# Patient Record
Sex: Female | Born: 1950 | Race: White | Hispanic: No | Marital: Married | Smoking: Never smoker
Health system: Southern US, Community
[De-identification: ages and names within clinical notes are randomized; demographics above are authoritative.]

## PROBLEM LIST (undated history)

## (undated) DIAGNOSIS — M1990 Unspecified osteoarthritis, unspecified site: Secondary | ICD-10-CM

## (undated) DIAGNOSIS — I10 Essential (primary) hypertension: Secondary | ICD-10-CM

## (undated) DIAGNOSIS — Z9889 Other specified postprocedural states: Secondary | ICD-10-CM

## (undated) DIAGNOSIS — E039 Hypothyroidism, unspecified: Secondary | ICD-10-CM

## (undated) HISTORY — PX: BACK SURGERY: SHX140

## (undated) HISTORY — PX: BUNIONECTOMY: SHX129

## (undated) HISTORY — PX: BREAST BIOPSY: SHX20

## (undated) HISTORY — PX: KNEE SURGERY: SHX244

---

## 2006-02-06 ENCOUNTER — Ambulatory Visit: Payer: Self-pay | Admitting: Internal Medicine

## 2007-08-26 ENCOUNTER — Other Ambulatory Visit: Payer: Self-pay

## 2007-08-26 ENCOUNTER — Emergency Department: Payer: Self-pay | Admitting: Emergency Medicine

## 2007-08-27 ENCOUNTER — Ambulatory Visit: Payer: Self-pay | Admitting: Internal Medicine

## 2007-09-12 ENCOUNTER — Ambulatory Visit: Payer: Self-pay | Admitting: Unknown Physician Specialty

## 2007-09-18 ENCOUNTER — Ambulatory Visit: Payer: Self-pay | Admitting: Unknown Physician Specialty

## 2007-09-24 ENCOUNTER — Ambulatory Visit: Payer: Self-pay | Admitting: Gynecologic Oncology

## 2007-10-08 ENCOUNTER — Ambulatory Visit: Payer: Self-pay | Admitting: Gastroenterology

## 2007-12-09 ENCOUNTER — Emergency Department: Payer: Self-pay | Admitting: Emergency Medicine

## 2008-01-20 ENCOUNTER — Other Ambulatory Visit: Payer: Self-pay

## 2008-01-20 ENCOUNTER — Inpatient Hospital Stay: Payer: Self-pay | Admitting: Cardiovascular Disease

## 2008-10-12 ENCOUNTER — Ambulatory Visit: Payer: Self-pay | Admitting: Cardiovascular Disease

## 2010-01-25 ENCOUNTER — Ambulatory Visit: Payer: Self-pay | Admitting: General Surgery

## 2010-02-09 ENCOUNTER — Ambulatory Visit: Payer: Self-pay | Admitting: General Surgery

## 2010-02-24 ENCOUNTER — Ambulatory Visit: Payer: Self-pay | Admitting: General Surgery

## 2010-10-13 ENCOUNTER — Ambulatory Visit: Payer: Self-pay | Admitting: General Surgery

## 2010-10-27 ENCOUNTER — Ambulatory Visit: Payer: Self-pay | Admitting: General Surgery

## 2010-10-28 LAB — CANCER ANTIGEN 19-9: CA 19-9: 19 U/mL (ref 0–35)

## 2010-11-26 ENCOUNTER — Ambulatory Visit: Payer: Self-pay | Admitting: General Surgery

## 2014-11-27 ENCOUNTER — Ambulatory Visit
Admit: 2014-11-27 | Disposition: A | Discharge status: Home / Self Care | Payer: Self-pay | Attending: Unknown Physician Specialty | Admitting: Unknown Physician Specialty

## 2014-12-08 DIAGNOSIS — M7542 Impingement syndrome of left shoulder: Secondary | ICD-10-CM | POA: Insufficient documentation

## 2014-12-08 DIAGNOSIS — M1712 Unilateral primary osteoarthritis, left knee: Secondary | ICD-10-CM | POA: Insufficient documentation

## 2015-02-14 DIAGNOSIS — S8002XA Contusion of left knee, initial encounter: Secondary | ICD-10-CM | POA: Insufficient documentation

## 2015-09-22 DIAGNOSIS — M25512 Pain in left shoulder: Secondary | ICD-10-CM | POA: Insufficient documentation

## 2015-11-18 DIAGNOSIS — D0500 Lobular carcinoma in situ of unspecified breast: Secondary | ICD-10-CM | POA: Insufficient documentation

## 2016-01-17 DIAGNOSIS — R748 Abnormal levels of other serum enzymes: Secondary | ICD-10-CM | POA: Insufficient documentation

## 2016-01-17 DIAGNOSIS — E079 Disorder of thyroid, unspecified: Secondary | ICD-10-CM | POA: Insufficient documentation

## 2016-01-17 DIAGNOSIS — E785 Hyperlipidemia, unspecified: Secondary | ICD-10-CM | POA: Insufficient documentation

## 2016-01-17 DIAGNOSIS — M791 Myalgia, unspecified site: Secondary | ICD-10-CM | POA: Insufficient documentation

## 2016-01-17 DIAGNOSIS — E038 Other specified hypothyroidism: Secondary | ICD-10-CM | POA: Insufficient documentation

## 2016-09-26 IMAGING — MR MRI OF THE LEFT SHOULDER WITHOUT CONTRAST
5 series · 40 of 40 positions shown · non-contrast
Comparison: None.

CLINICAL DATA: Pressure and stabbing sensation in left shoulder for
1 month with limited range of motion. No acute injury or prior
relevant surgery. Initial encounter.

EXAM:
MRI OF THE LEFT SHOULDER WITHOUT CONTRAST
TECHNIQUE: Multiplanar, multisequence MR imaging of the shoulder was performed.
No intravenous contrast was administered.

[Series 3: T2 fat-sat · axial · 4.0mm · 0.55mm/px · z∈[-24,+68]mm · 8 of 22 slices shown (1 of 3)]
[im 1/22]
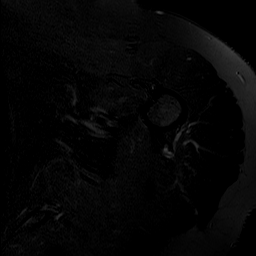
[im 4/22]
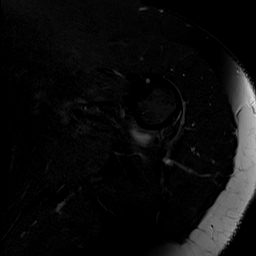
[im 7/22]
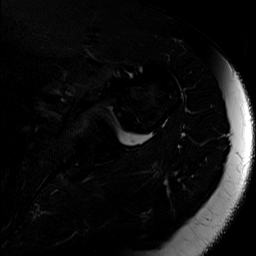
[im 10/22]
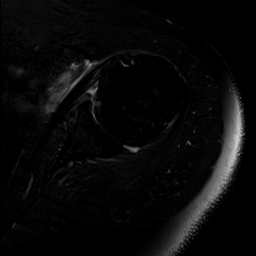
[im 13/22]
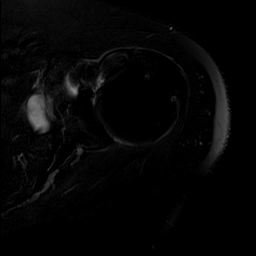
[im 16/22]
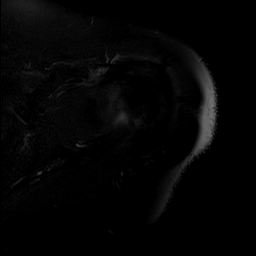
[im 19/22]
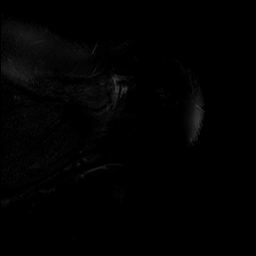
[im 22/22]
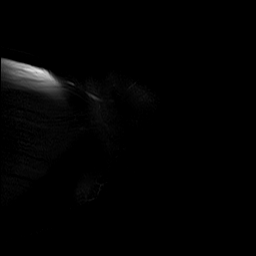

[Series 4: T2 fat-sat · coronal · 4.0mm · 0.55mm/px · 7 of 20 slices shown (2 of 3)]
[im 1/20]
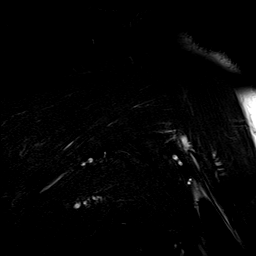
[im 4/20]
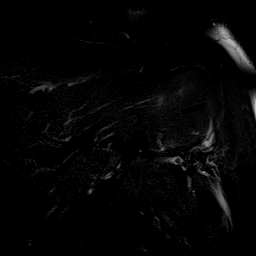
[im 7/20]
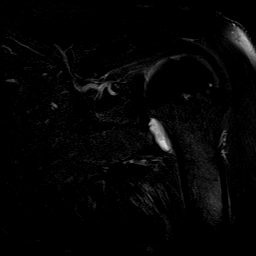
[im 10/20]
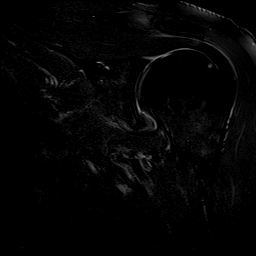
[im 13/20]
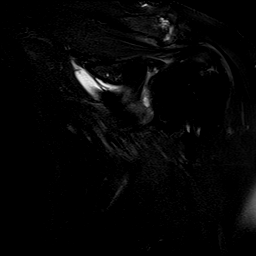
[im 16/20]
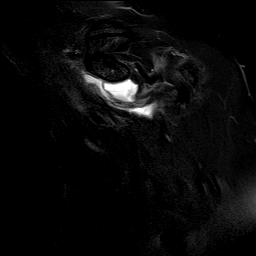
[im 20/20]
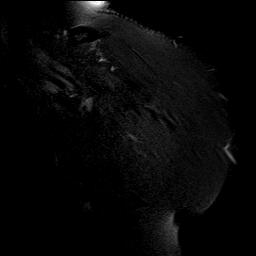

[Series 6: T1 · sagittal · 4.0mm · 0.55mm/px · 9 of 23 slices shown]
[im 1/23]
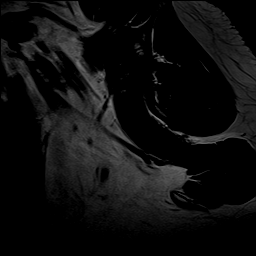
[im 3/23]
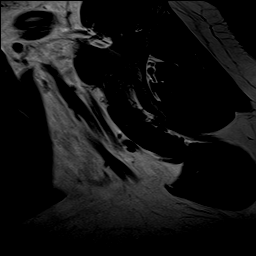
[im 6/23]
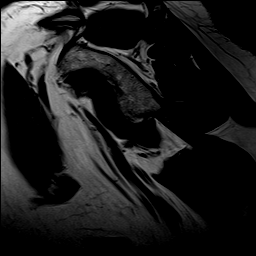
[im 9/23]
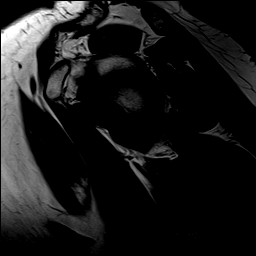
[im 12/23]
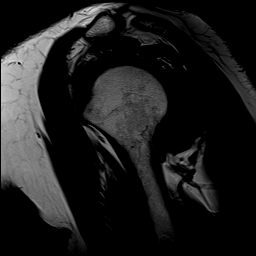
[im 14/23]
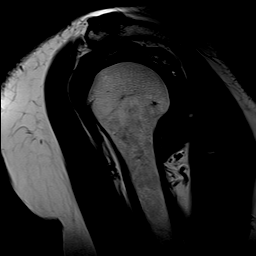
[im 17/23]
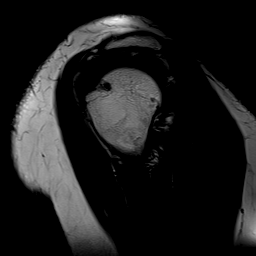
[im 20/23]
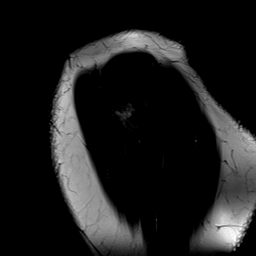
[im 23/23]
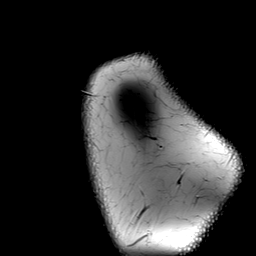

[Series 7: T2 fat-sat · sagittal · 4.0mm · 0.55mm/px · 9 of 23 slices shown (3 of 3)]
[im 1/23]
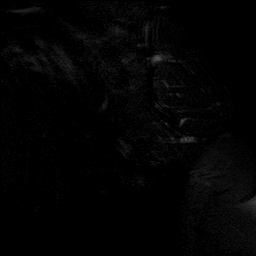
[im 3/23]
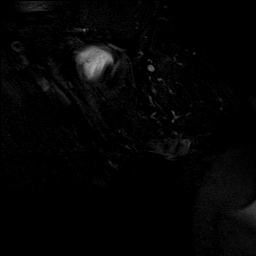
[im 6/23]
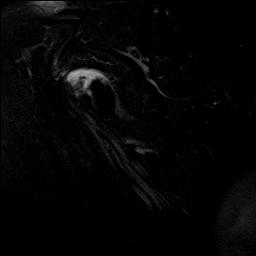
[im 9/23]
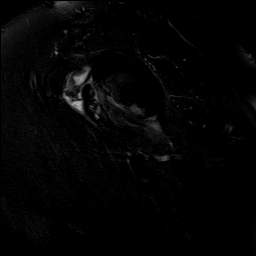
[im 12/23]
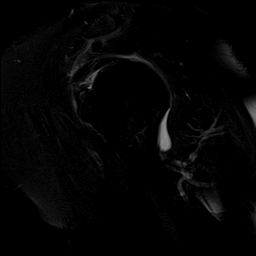
[im 14/23]
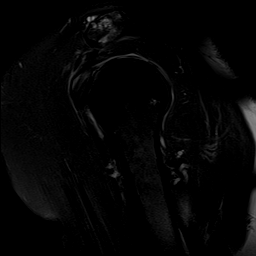
[im 17/23]
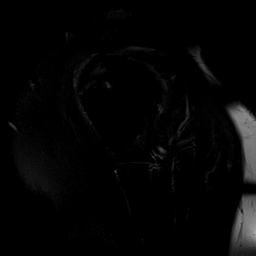
[im 20/23]
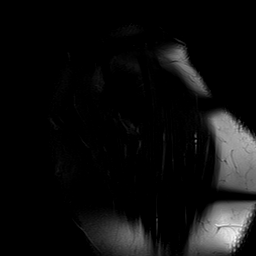
[im 23/23]
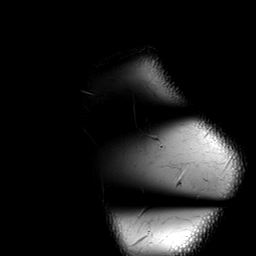

[Series 8: PD · coronal · 4.0mm · 0.55mm/px · 7 of 20 slices shown]
[im 1/20]
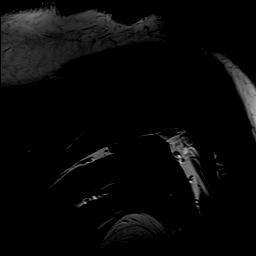
[im 4/20]
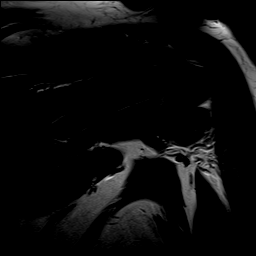
[im 7/20]
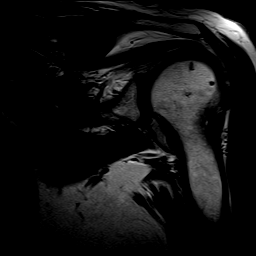
[im 10/20]
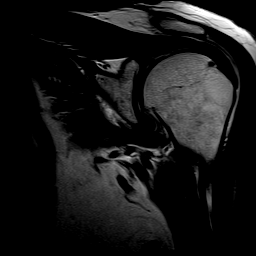
[im 13/20]
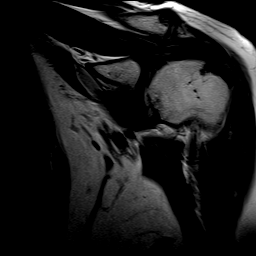
[im 16/20]
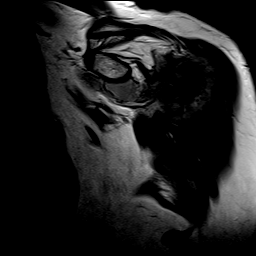
[im 20/20]
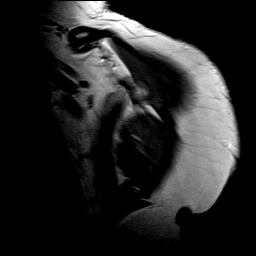

[40 of 40 positions shown; findings below may reference images not displayed]

FINDINGS: Rotator cuff: Moderate supraspinatus tendinosis with small focus of
partial articular surface insertional tearing anteriorly, best seen
on the coronal images. No full-thickness rotator cuff tear. The
subscapularis tendon also demonstrates tendinosis and mild
attenuation without focal tear. The infraspinous and teres minor
tendons appear normal.

Muscles:  No focal muscular atrophy or edema.

Biceps long head: Intact and normally positioned. The
intra-articular portion demonstrates moderate tendinosis.

Acromioclavicular Joint: The acromion is type 2. There are moderate
acromioclavicular degenerative changes. A small amount of fluid is
present in the subacromial-subdeltoid bursa.

Glenohumeral Joint: Mild glenohumeral degenerative changes. There is
a moderate size shoulder joint effusion with prominent fluid in the
superior subscapularis recess. No loose bodies observed. The joint
capsule appears mildly edematous within the axillary recess and
rotator interval, suggesting possible adhesive capsulitis.

Labrum: Mild labral degeneration. No focal tear or paralabral cyst
identified.

Bones:  No significant extra-articular osseous findings.
IMPRESSION: 1. Rotator cuff tendinosis with partial articular surface
insertional tearing of the anterior supraspinatus and subscapularis
tendons as described. No full-thickness tendon tear, tendon
retraction or muscular atrophy.
2. Bicipital tendinosis and mild labral degeneration.
3. Joint capsular edema suggesting adhesive capsulitis.
4. Moderate acromioclavicular degenerative changes.

## 2016-12-04 ENCOUNTER — Telehealth: Payer: Self-pay | Admitting: Gastroenterology

## 2016-12-04 NOTE — Telephone Encounter (Signed)
Patient called and is ready to schedule her colonoscopy. 

## 2016-12-05 ENCOUNTER — Other Ambulatory Visit: Payer: Self-pay

## 2016-12-05 ENCOUNTER — Telehealth: Payer: Self-pay | Admitting: Gastroenterology

## 2016-12-05 ENCOUNTER — Telehealth: Payer: Self-pay

## 2016-12-05 DIAGNOSIS — Z1211 Encounter for screening for malignant neoplasm of colon: Secondary | ICD-10-CM

## 2016-12-05 NOTE — Telephone Encounter (Signed)
Gastroenterology Pre-Procedure Review  Request Date:  Requesting Physician: Dr.   PATIENT REVIEW QUESTIONS: The patient responded to the following health history questions as indicated:    1. Are you having any GI issues? no 2. Do you have a personal history of Polyps? no 3. Do you have a family history of Colon Cancer or Polyps? no 4. Diabetes Mellitus? no 5. Joint replacements in the past 12 months?no 6. Major health problems in the past 3 months?no 7. Any artificial heart valves, MVP, or defibrillator?yes (Stent placed in 2009)    MEDICATIONS & ALLERGIES:    Patient reports the following regarding taking any anticoagulation/antiplatelet therapy:   Plavix, Coumadin, Eliquis, Xarelto, Lovenox, Pradaxa, Brilinta, or Effient? yes (Plavix 75mg  - Dr. Rosario Jacks) Aspirin? no  Patient confirms/reports the following medications:  No current outpatient prescriptions on file.   No current facility-administered medications for this visit.     Patient confirms/reports the following allergies:  Allergies not on file  No orders of the defined types were placed in this encounter.   AUTHORIZATION INFORMATION Primary Insurance: 1D#: Group #:  Secondary Insurance: 1D#: Group #:  SCHEDULE INFORMATION: Date: 12/27/16 Time: Location: [place]

## 2016-12-05 NOTE — Telephone Encounter (Signed)
12/05/16 No auth required per web site Decision ID# [id number].

## 2016-12-07 ENCOUNTER — Other Ambulatory Visit: Payer: Self-pay

## 2016-12-14 ENCOUNTER — Telehealth: Payer: Self-pay

## 2016-12-14 NOTE — Telephone Encounter (Signed)
Pt notified per Dr. Rosario Jacks, she may stop the Plavix 5 days prior to her colonoscopy and restart one day after.

## 2016-12-25 NOTE — Discharge Instructions (Signed)

## 2016-12-27 ENCOUNTER — Encounter
Admission: RE | Disposition: A | Discharge status: Home / Self Care | Payer: Self-pay | Source: Ambulatory Visit | Attending: Gastroenterology

## 2016-12-27 ENCOUNTER — Ambulatory Visit: Payer: Medicare Other | Admitting: Anesthesiology

## 2016-12-27 ENCOUNTER — Ambulatory Visit
Admission: RE | Admit: 2016-12-27 | Discharge: 2016-12-27 | Disposition: A | Discharge status: Home / Self Care | Payer: Medicare Other | Source: Ambulatory Visit | Attending: Gastroenterology | Admitting: Gastroenterology

## 2016-12-27 DIAGNOSIS — I10 Essential (primary) hypertension: Secondary | ICD-10-CM | POA: Insufficient documentation

## 2016-12-27 DIAGNOSIS — D120 Benign neoplasm of cecum: Secondary | ICD-10-CM | POA: Diagnosis not present

## 2016-12-27 DIAGNOSIS — Z1211 Encounter for screening for malignant neoplasm of colon: Secondary | ICD-10-CM | POA: Diagnosis not present

## 2016-12-27 DIAGNOSIS — Z79899 Other long term (current) drug therapy: Secondary | ICD-10-CM | POA: Insufficient documentation

## 2016-12-27 DIAGNOSIS — E039 Hypothyroidism, unspecified: Secondary | ICD-10-CM | POA: Diagnosis not present

## 2016-12-27 DIAGNOSIS — M1990 Unspecified osteoarthritis, unspecified site: Secondary | ICD-10-CM | POA: Insufficient documentation

## 2016-12-27 DIAGNOSIS — K640 First degree hemorrhoids: Secondary | ICD-10-CM | POA: Insufficient documentation

## 2016-12-27 HISTORY — DX: Hypothyroidism, unspecified: E03.9

## 2016-12-27 HISTORY — DX: Unspecified osteoarthritis, unspecified site: M19.90

## 2016-12-27 HISTORY — DX: Essential (primary) hypertension: I10

## 2016-12-27 HISTORY — DX: Other specified postprocedural states: Z98.890

## 2016-12-27 HISTORY — PX: POLYPECTOMY: SHX5525

## 2016-12-27 HISTORY — PX: COLONOSCOPY WITH PROPOFOL: SHX5780

## 2016-12-27 LAB — HM COLONOSCOPY

## 2016-12-27 SURGERY — COLONOSCOPY WITH PROPOFOL
Anesthesia: Monitor Anesthesia Care | Wound class: Clean Contaminated

## 2016-12-27 MED ORDER — PROPOFOL 10 MG/ML IV BOLUS
INTRAVENOUS | Status: DC | PRN
  Administered 2016-12-27: 100 mg via INTRAVENOUS
  Administered 2016-12-27: 50 mg via INTRAVENOUS

## 2016-12-27 MED ORDER — ACETAMINOPHEN 325 MG PO TABS: 325.0000 mg | ORAL_TABLET | ORAL | Status: DC | PRN

## 2016-12-27 MED ORDER — LIDOCAINE HCL (CARDIAC) 20 MG/ML IV SOLN
INTRAVENOUS | Status: DC | PRN
  Administered 2016-12-27: 50 mg via INTRAVENOUS

## 2016-12-27 MED ORDER — SODIUM CHLORIDE 0.9 % IJ SOLN
INTRAMUSCULAR | Status: DC | PRN
  Administered 2016-12-27: 3 mL

## 2016-12-27 MED ORDER — ACETAMINOPHEN 160 MG/5ML PO SOLN: 325.0000 mg | ORAL | Status: DC | PRN

## 2016-12-27 MED ORDER — STERILE WATER FOR IRRIGATION IR SOLN
Status: DC | PRN
  Administered 2016-12-27: 5 mL

## 2016-12-27 MED ORDER — LACTATED RINGERS IV SOLN
INTRAVENOUS | Status: DC
  Administered 2016-12-27: 07:00:00 via INTRAVENOUS

## 2016-12-27 SURGICAL SUPPLY — 24 items
CANISTER SUCT 1200ML W/VALVE (MISCELLANEOUS) ×3 IMPLANT
CLIP HMST 235XBRD CATH ROT (MISCELLANEOUS) IMPLANT
CLIP HMST11XOPN 235X2.8X (MISCELLANEOUS) ×2 IMPLANT
CLIP RESOLUTION 360 11X235 (MISCELLANEOUS) ×4
FCP ESCP3.2XJMB 240X2.8X (MISCELLANEOUS)
FORCEPS BIOP RAD 4 LRG CAP 4 (CUTTING FORCEPS) IMPLANT
FORCEPS BIOP RJ4 240 W/NDL (MISCELLANEOUS)
FORCEPS ESCP3.2XJMB 240X2.8X (MISCELLANEOUS) IMPLANT
GOWN CVR UNV OPN BCK APRN NK (MISCELLANEOUS) ×2 IMPLANT
GOWN ISOL THUMB LOOP REG UNIV (MISCELLANEOUS) ×4
INJECTOR VARIJECT VIN23 (MISCELLANEOUS) ×1 IMPLANT
KIT DEFENDO VALVE AND CONN (KITS) IMPLANT
KIT ENDO PROCEDURE OLY (KITS) ×3 IMPLANT
MARKER SPOT ENDO TATTOO 5ML (MISCELLANEOUS) IMPLANT
PAD GROUND ADULT SPLIT (MISCELLANEOUS) ×3 IMPLANT
PROBE APC STR FIRE (PROBE) IMPLANT
RETRIEVER NET ROTH 2.5X230 LF (MISCELLANEOUS) IMPLANT
SNARE SHORT THROW 13M SML OVAL (MISCELLANEOUS) ×6 IMPLANT
SNARE SHORT THROW 30M LRG OVAL (MISCELLANEOUS) IMPLANT
SNARE SNG USE RND 15MM (INSTRUMENTS) IMPLANT
SPOT EX ENDOSCOPIC TATTOO (MISCELLANEOUS)
TRAP ETRAP POLY (MISCELLANEOUS) ×3 IMPLANT
VARIJECT INJECTOR VIN23 (MISCELLANEOUS) ×3
WATER STERILE IRR 250ML POUR (IV SOLUTION) ×3 IMPLANT

## 2016-12-27 NOTE — H&P (Signed)
   Lucilla Lame, MD [facility] [address] [place], [place] [address] Phone: [phone] Fax : [phone]  Primary Care Physician:  Casilda Carls Primary Gastroenterologist:  Dr. Allen Norris  Pre-Procedure History & Physical: HPI:  Tammye Kahler is a 66 y.o. female is here for a screening colonoscopy.   Past Medical History:  Diagnosis Date  . Arthritis    "everywhere" esp Left knee  . Hx of cardiac cath    with stent in 2009  . Hypertension   . Hypothyroidism     Past Surgical History:  Procedure Laterality Date  . BACK SURGERY    . BREAST BIOPSY    . BUNIONECTOMY    . KNEE SURGERY      Prior to Admission medications   Medication Sig Start Date End Date Taking? Authorizing Provider  Calcium Carbonate-Vitamin D 600-400 MG-UNIT tablet Take by mouth.   Yes Historical Provider, MD  clopidogrel (PLAVIX) 75 MG tablet Take 75 mg by mouth. 10/15/15  Yes Historical Provider, MD  diclofenac sodium (VOLTAREN) 1 % GEL Apply topically. 02/10/15  Yes Historical Provider, MD  ezetimibe (ZETIA) 10 MG tablet  02/29/16  Yes Historical Provider, MD  hydrochlorothiazide (HYDRODIURIL) 25 MG tablet Take 25 mg by mouth. 10/15/15  Yes Historical Provider, MD  levothyroxine (SYNTHROID) 100 MCG tablet Reported on 01/13/2016 03/10/15  Yes Historical Provider, MD  lisinopril (PRINIVIL,ZESTRIL) 5 MG tablet  08/10/14  Yes Historical Provider, MD  metoprolol succinate (TOPROL-XL) 50 MG 24 hr tablet Take by mouth.   Yes Historical Provider, MD  simvastatin (ZOCOR) 20 MG tablet  05/31/16  Yes Historical Provider, MD  sodium fluoride (PREVIDENT) 1.1 % GEL dental gel Apply topically. 06/21/15  Yes Historical Provider, MD  ezetimibe-simvastatin (VYTORIN) 10-20 MG tablet Reported on 01/13/2016 08/10/14   Historical Provider, MD  traMADol (ULTRAM) 50 MG tablet Take by mouth. 01/13/16   Historical Provider, MD    Allergies as of 12/05/2016  . (No Known Allergies)    History reviewed. No pertinent family  history.  Social History   Social History  . Marital status: Married    Spouse name: N/A  . Number of children: N/A  . Years of education: N/A   Occupational History  . Not on file.   Social History Main Topics  . Smoking status: Never Smoker  . Smokeless tobacco: Never Used  . Alcohol use No  . Drug use: Unknown  . Sexual activity: Not on file   Other Topics Concern  . Not on file   Social History Narrative  . No narrative on file    Review of Systems: See HPI, otherwise negative ROS  Physical Exam: BP (!) 143/87   Pulse 78   Temp 97.9 F (36.6 C) (Temporal)   Resp 16   Ht 5\' 8"  (1.727 m)   Wt 168 lb (76.2 kg)   SpO2 98%   BMI 25.54 kg/m  General:   Alert,  pleasant and cooperative in NAD Head:  Normocephalic and atraumatic. Neck:  Supple; no masses or thyromegaly. Lungs:  Clear throughout to auscultation.    Heart:  Regular rate and rhythm. Abdomen:  Soft, nontender and nondistended. Normal bowel sounds, without guarding, and without rebound.   Neurologic:  Alert and  oriented x4;  grossly normal neurologically.  Impression/Plan: Donovan Gatchel is now here to undergo a screening colonoscopy.  Risks, benefits, and alternatives regarding colonoscopy have been reviewed with the patient.  Questions have been answered.  All parties agreeable.

## 2016-12-27 NOTE — Anesthesia Procedure Notes (Signed)
Procedure Name: MAC Date/Time: 12/27/2016 7:59 AM Performed by: Janna Arch Pre-anesthesia Checklist: Patient identified, Emergency Drugs available, Suction available and Patient being monitored Patient Re-evaluated:Patient Re-evaluated prior to inductionOxygen Delivery Method: Nasal cannula

## 2016-12-27 NOTE — Anesthesia Postprocedure Evaluation (Signed)
Anesthesia Post Note  Patient: Kristen Oneal  Procedure(s) Performed: Procedure(s) (LRB): COLONOSCOPY WITH PROPOFOL (N/A) POLYPECTOMY (N/A)  Patient location during evaluation: PACU Anesthesia Type: MAC Level of consciousness: awake and alert and oriented Pain management: satisfactory to patient Vital Signs Assessment: post-procedure vital signs reviewed and stable Respiratory status: spontaneous breathing, nonlabored ventilation and respiratory function stable Cardiovascular status: blood pressure returned to baseline and stable Postop Assessment: Adequate PO intake and No signs of nausea or vomiting Anesthetic complications: no    Raliegh Ip

## 2016-12-27 NOTE — Op Note (Signed)
[facility] Gastroenterology Patient Name: Kristen Oneal Procedure Date: 12/27/2016 8:01 AM MRN: [id number] Account #: [id number] Date of Birth: [birth year] Admit Type: Outpatient Age: 66 Room: [facility] OR ROOM 01 Gender: Female Note Status: Finalized Procedure:            Colonoscopy Indications:          Screening for colorectal malignant neoplasm Providers:            Lucilla Lame MD, MD Referring MD:         Casilda Carls, MD (Referring MD) Medicines:            Propofol per Anesthesia Complications:        No immediate complications. Procedure:            Pre-Anesthesia Assessment:                       - Prior to the procedure, a History and Physical was                        performed, and patient medications and allergies were                        reviewed. The patient's tolerance of previous                        anesthesia was also reviewed. The risks and benefits of                        the procedure and the sedation options and risks were                        discussed with the patient. All questions were                        answered, and informed consent was obtained. Prior                        Anticoagulants: The patient has taken no previous                        anticoagulant or antiplatelet agents. ASA Grade                        Assessment: II - A patient with mild systemic disease.                        After reviewing the risks and benefits, the patient was                        deemed in satisfactory condition to undergo the                        procedure.                       After obtaining informed consent, the colonoscope was                        passed under direct vision. Throughout the procedure,  the patient's blood pressure, pulse, and oxygen                        saturations were monitored continuously. The Olympus                        Colonoscope 190 [id number]) was introduced through the                      anus and advanced to the the cecum, identified by                        appendiceal orifice and ileocecal valve. The                        colonoscopy was performed without difficulty. The                        patient tolerated the procedure well. The quality of                        the bowel preparation was excellent. Findings:      The perianal and digital rectal examinations were normal.      A 8 mm polyp was found in the cecum. The polyp was sessile. Area was       successfully injected with 2 mL saline for a lift polypectomy. The polyp       was removed with a hot snare. Resection and retrieval were complete. To       prevent bleeding after the polypectomy, two hemostatic clips were       successfully placed (MR conditional). There was no bleeding at the end       of the procedure.      Non-bleeding internal hemorrhoids were found during retroflexion. The       hemorrhoids were Grade I (internal hemorrhoids that do not prolapse). Impression:           - One 8 mm polyp in the cecum, removed with a hot                        snare. Resected and retrieved. Injected. Clips (MR                        conditional) were placed.                       - Non-bleeding internal hemorrhoids. Recommendation:       - Discharge patient to home.                       - Resume previous diet.                       - Continue present medications.                       - Await pathology results.                       - Repeat colonoscopy in 5 years if polyp adenoma and 10  years if hyperplastic Procedure Code(s):    --- Professional ---                       [phone], Colonoscopy, flexible; with removal of tumor(s),                        polyp(s), or other lesion(s) by snare technique                       [id number], Colonoscopy, flexible; with directed submucosal                        injection(s), any substance Diagnosis Code(s):    --- Professional ---                        Z12.11, Encounter for screening for malignant neoplasm                        of colon                       D12.0, Benign neoplasm of cecum CPT copyright 2016 American Medical Association. All rights reserved. The codes documented in this report are preliminary and upon coder review may  be revised to meet current compliance requirements. Lucilla Lame MD, MD 12/27/2016 8:22:26 AM This report has been signed electronically. Number of Addenda: 0 Note Initiated On: 12/27/2016 8:01 AM Scope Withdrawal Time: 0 hours 11 minutes 57 seconds  Total Procedure Duration: 0 hours 14 minutes 39 seconds       [facility]

## 2016-12-27 NOTE — Transfer of Care (Signed)
Immediate Anesthesia Transfer of Care Note  Patient: Kristen Oneal  Procedure(s) Performed: Procedure(s): COLONOSCOPY WITH PROPOFOL (N/A) POLYPECTOMY (N/A)  Patient Location: PACU  Anesthesia Type: MAC  Level of Consciousness: awake, alert  and patient cooperative  Airway and Oxygen Therapy: Patient Spontanous Breathing and Patient connected to supplemental oxygen  Post-op Assessment: Post-op Vital signs reviewed, Patient's Cardiovascular Status Stable, Respiratory Function Stable, Patent Airway and No signs of Nausea or vomiting  Post-op Vital Signs: Reviewed and stable  Complications: No apparent anesthesia complications

## 2016-12-27 NOTE — Anesthesia Preprocedure Evaluation (Signed)
Anesthesia Evaluation  Patient identified by MRN, date of birth, ID band Patient awake    Reviewed: Allergy & Precautions, H&P , NPO status , Patient's Chart, lab work & pertinent test results  Airway Mallampati: II  TM Distance: >3 FB Neck ROM: full    Dental no notable dental hx.    Pulmonary    Pulmonary exam normal        Cardiovascular hypertension, Normal cardiovascular exam     Neuro/Psych    GI/Hepatic   Endo/Other  Hypothyroidism   Renal/GU      Musculoskeletal   Abdominal   Peds  Hematology   Anesthesia Other Findings   Reproductive/Obstetrics                             Anesthesia Physical Anesthesia Plan  ASA: II  Anesthesia Plan: MAC   Post-op Pain Management:    Induction:   Airway Management Planned:   Additional Equipment:   Intra-op Plan:   Post-operative Plan:   Informed Consent: I have reviewed the patients History and Physical, chart, labs and discussed the procedure including the risks, benefits and alternatives for the proposed anesthesia with the patient or authorized representative who has indicated his/her understanding and acceptance.     Plan Discussed with:   Anesthesia Plan Comments:         Anesthesia Quick Evaluation

## 2016-12-28 ENCOUNTER — Encounter: Payer: Self-pay | Admitting: Gastroenterology

## 2016-12-31 ENCOUNTER — Encounter: Payer: Self-pay | Admitting: Gastroenterology

## 2020-11-20 ENCOUNTER — Ambulatory Visit: Payer: Self-pay

## 2022-04-10 ENCOUNTER — Telehealth: Payer: Self-pay

## 2022-04-10 NOTE — Telephone Encounter (Signed)
Kristen Oneal with Dr. Rosario Jacks office called and left a message to find out when patient last colonoscopy was and when she was due for one. Last colonoscopy was 12/27/2016 and is due in 5 years from the last so she is due now. Called and left a message informing [name] this information

## 2022-04-12 ENCOUNTER — Telehealth: Payer: Self-pay

## 2022-04-12 ENCOUNTER — Other Ambulatory Visit: Payer: Self-pay

## 2022-04-12 DIAGNOSIS — Z86010 Personal history of colonic polyps: Secondary | ICD-10-CM

## 2022-04-12 MED ORDER — PEG 3350-KCL-NA BICARB-NACL 420 G PO SOLR: 4000.0000 mL | Freq: Once | ORAL | 0 refills | Status: AC

## 2022-04-12 NOTE — Telephone Encounter (Signed)
Gastroenterology Pre-Procedure Review  Request Date: 06/25/22 Requesting Physician: Dr. Allen Norris  PATIENT REVIEW QUESTIONS: The patient responded to the following health history questions as indicated:    1. Are you having any GI issues? no 2. Do you have a personal history of Polyps? yes (12/27/16 performed by Dr. Allen Norris) 3. Do you have a family history of Colon Cancer or Polyps? no 4. Diabetes Mellitus? no 5. Joint replacements in the past 12 months?no 6. Major health problems in the past 3 months?no 7. Any artificial heart valves, MVP, or defibrillator?no    MEDICATIONS & ALLERGIES:    Patient reports the following regarding taking any anticoagulation/antiplatelet therapy:   Plavix, Coumadin, Eliquis, Xarelto, Lovenox, Pradaxa, Brilinta, or Effient? no Aspirin? no  Patient confirms/reports the following medications:  Current Outpatient Medications  Medication Sig Dispense Refill   Calcium Carbonate-Vitamin D 600-400 MG-UNIT tablet Take by mouth.     clopidogrel (PLAVIX) 75 MG tablet Take 75 mg by mouth.     diclofenac sodium (VOLTAREN) 1 % GEL Apply topically.     ezetimibe (ZETIA) 10 MG tablet      ezetimibe-simvastatin (VYTORIN) 10-20 MG tablet Reported on 01/13/2016     hydrochlorothiazide (HYDRODIURIL) 25 MG tablet Take 25 mg by mouth.     levothyroxine (SYNTHROID) 100 MCG tablet Reported on 01/13/2016     lisinopril (PRINIVIL,ZESTRIL) 5 MG tablet      metoprolol succinate (TOPROL-XL) 50 MG 24 hr tablet Take by mouth.     simvastatin (ZOCOR) 20 MG tablet      sodium fluoride (PREVIDENT) 1.1 % GEL dental gel Apply topically.     traMADol (ULTRAM) 50 MG tablet Take by mouth.     No current facility-administered medications for this visit.    Patient confirms/reports the following allergies:  No Known Allergies  No orders of the defined types were placed in this encounter.   AUTHORIZATION INFORMATION Primary Insurance: 1D#: Group #:  Secondary Insurance: 1D#: Group  #:  SCHEDULE INFORMATION: Date: 06/25/22 Time: Location: [facility]

## 2022-07-05 ENCOUNTER — Encounter: Payer: Self-pay | Admitting: Gastroenterology

## 2022-07-12 ENCOUNTER — Telehealth: Payer: Self-pay

## 2022-07-12 NOTE — Telephone Encounter (Signed)
Patient has been informed that she needed to stop her blood thinner 5 days prior to her colonoscopy (tomorrow) per Dr. Rosario Jacks.  She said she was already aware and stopped it accordingly.  Thanks,  [employer]

## 2022-07-13 ENCOUNTER — Other Ambulatory Visit: Payer: Self-pay

## 2022-07-13 ENCOUNTER — Ambulatory Visit: Payer: Medicare PPO | Admitting: Anesthesiology

## 2022-07-13 ENCOUNTER — Ambulatory Visit
Admission: RE | Admit: 2022-07-13 | Discharge: 2022-07-13 | Disposition: A | Discharge status: Home / Self Care | Payer: Medicare PPO | Attending: Gastroenterology | Admitting: Gastroenterology

## 2022-07-13 ENCOUNTER — Encounter
Admission: RE | Disposition: A | Discharge status: Home / Self Care | Payer: Self-pay | Source: Home / Self Care | Attending: Gastroenterology

## 2022-07-13 ENCOUNTER — Encounter: Payer: Self-pay | Admitting: Gastroenterology

## 2022-07-13 DIAGNOSIS — Z1211 Encounter for screening for malignant neoplasm of colon: Secondary | ICD-10-CM | POA: Diagnosis present

## 2022-07-13 DIAGNOSIS — Z7902 Long term (current) use of antithrombotics/antiplatelets: Secondary | ICD-10-CM | POA: Diagnosis not present

## 2022-07-13 DIAGNOSIS — Z955 Presence of coronary angioplasty implant and graft: Secondary | ICD-10-CM | POA: Diagnosis not present

## 2022-07-13 DIAGNOSIS — M1990 Unspecified osteoarthritis, unspecified site: Secondary | ICD-10-CM | POA: Diagnosis not present

## 2022-07-13 DIAGNOSIS — E039 Hypothyroidism, unspecified: Secondary | ICD-10-CM | POA: Insufficient documentation

## 2022-07-13 DIAGNOSIS — K640 First degree hemorrhoids: Secondary | ICD-10-CM | POA: Insufficient documentation

## 2022-07-13 DIAGNOSIS — Z860100 Personal history of colon polyps, unspecified: Secondary | ICD-10-CM

## 2022-07-13 DIAGNOSIS — I2510 Atherosclerotic heart disease of native coronary artery without angina pectoris: Secondary | ICD-10-CM | POA: Diagnosis not present

## 2022-07-13 DIAGNOSIS — I10 Essential (primary) hypertension: Secondary | ICD-10-CM | POA: Insufficient documentation

## 2022-07-13 HISTORY — PX: COLONOSCOPY WITH PROPOFOL: SHX5780

## 2022-07-13 SURGERY — COLONOSCOPY WITH PROPOFOL
Anesthesia: General | Site: Rectum

## 2022-07-13 MED ORDER — SODIUM CHLORIDE 0.9 % IV SOLN: INTRAVENOUS | Status: DC

## 2022-07-13 MED ORDER — LACTATED RINGERS IV SOLN: INTRAVENOUS | Status: DC

## 2022-07-13 MED ORDER — ONDANSETRON HCL 4 MG/2ML IJ SOLN: 4.0000 mg | Freq: Once | INTRAMUSCULAR | Status: DC | PRN

## 2022-07-13 MED ORDER — ACETAMINOPHEN 160 MG/5ML PO SOLN: 325.0000 mg | ORAL | Status: DC | PRN

## 2022-07-13 MED ORDER — PROPOFOL 10 MG/ML IV BOLUS
INTRAVENOUS | Status: DC | PRN
  Administered 2022-07-13 (×3): 50 mg via INTRAVENOUS
  Administered 2022-07-13: 100 mg via INTRAVENOUS

## 2022-07-13 MED ORDER — STERILE WATER FOR IRRIGATION IR SOLN
Status: DC | PRN
  Administered 2022-07-13: 100 mL

## 2022-07-13 MED ORDER — ACETAMINOPHEN 325 MG PO TABS: 650.0000 mg | ORAL_TABLET | Freq: Once | ORAL | Status: DC | PRN

## 2022-07-13 SURGICAL SUPPLY — 6 items
GOWN CVR UNV OPN BCK APRN NK (MISCELLANEOUS) ×2 IMPLANT
GOWN ISOL THUMB LOOP REG UNIV (MISCELLANEOUS) ×2
KIT PRC NS LF DISP ENDO (KITS) ×1 IMPLANT
KIT PROCEDURE OLYMPUS (KITS) ×1
MANIFOLD NEPTUNE II (INSTRUMENTS) ×1 IMPLANT
WATER STERILE IRR 250ML POUR (IV SOLUTION) ×1 IMPLANT

## 2022-07-13 NOTE — Op Note (Signed)
[facility] Gastroenterology Patient Name: Kristen Oneal Procedure Date: 07/13/2022 10:02 AM MRN: [id number] Account #: [id number] Date of Birth: [birth year] Admit Type: Outpatient Age: 71 Room: [facility] OR ROOM 01 Gender: Female Note Status: Finalized Instrument Name: [id number] Procedure:             Colonoscopy Indications:           High risk colon cancer surveillance: Personal history                         of colonic polyps Providers:             Lucilla Lame MD, MD Referring MD:          Casilda Carls, MD (Referring MD) Medicines:             Propofol per Anesthesia Complications:         No immediate complications. Procedure:             Pre-Anesthesia Assessment:                        - Prior to the procedure, a History and Physical was                         performed, and patient medications and allergies were                         reviewed. The patient's tolerance of previous                         anesthesia was also reviewed. The risks and benefits                         of the procedure and the sedation options and risks                         were discussed with the patient. All questions were                         answered, and informed consent was obtained. Prior                         Anticoagulants: The patient has taken no anticoagulant                         or antiplatelet agents. ASA Grade Assessment: II - A                         patient with mild systemic disease. After reviewing                         the risks and benefits, the patient was deemed in                         satisfactory condition to undergo the procedure.                        After obtaining informed consent, the colonoscope was  passed under direct vision. Throughout the procedure,                         the patient's blood pressure, pulse, and oxygen                         saturations were monitored continuously. The                          Colonoscope was introduced through the anus and                         advanced to the the cecum, identified by appendiceal                         orifice and ileocecal valve. The colonoscopy was                         performed without difficulty. The patient tolerated                         the procedure well. The quality of the bowel                         preparation was excellent. Findings:      The perianal and digital rectal examinations were normal.      Non-bleeding internal hemorrhoids were found during retroflexion. The       hemorrhoids were Grade I (internal hemorrhoids that do not prolapse). Impression:            - Non-bleeding internal hemorrhoids.                        - No specimens collected. Recommendation:        - Discharge patient to home.                        - Resume previous diet.                        - Continue present medications.                        - Repeat colonoscopy is not recommended for                         surveillance. Procedure Code(s):     --- Professional ---                        [id number], Colonoscopy, flexible; diagnostic, including                         collection of specimen(s) by brushing or washing, when                         performed (separate procedure) Diagnosis Code(s):     --- Professional ---                        Z86.010, Personal history of colonic polyps CPT copyright 2022 American Medical Association. All  rights reserved. The codes documented in this report are preliminary and upon coder review may  be revised to meet current compliance requirements. Lucilla Lame MD, MD 07/13/2022 10:25:44 AM This report has been signed electronically. Number of Addenda: 0 Note Initiated On: 07/13/2022 10:02 AM Scope Withdrawal Time: 0 hours 7 minutes 38 seconds  Total Procedure Duration: 0 hours 13 minutes 8 seconds  Estimated Blood Loss:  Estimated blood loss: none.      [facility]

## 2022-07-13 NOTE — Anesthesia Postprocedure Evaluation (Signed)
Anesthesia Post Note  Patient: Kristen Oneal  Procedure(s) Performed: COLONOSCOPY WITH PROPOFOL (Rectum)  Patient location during evaluation: PACU Anesthesia Type: General Level of consciousness: awake and alert, oriented and patient cooperative Pain management: pain level controlled Vital Signs Assessment: post-procedure vital signs reviewed and stable Respiratory status: spontaneous breathing, nonlabored ventilation and respiratory function stable Cardiovascular status: blood pressure returned to baseline and stable Postop Assessment: adequate PO intake Anesthetic complications: no   No notable events documented.   Last Vitals:  Vitals:   07/13/22 1030 07/13/22 1041  BP: 127/64 125/79  Pulse: 67 67  Resp: 16   Temp:    SpO2: 96% 97%    Last Pain:  Vitals:   07/13/22 1028  TempSrc:   PainSc: Asleep                 Darrin Nipper

## 2022-07-13 NOTE — H&P (Signed)
Lucilla Lame, MD [facility] [address] [place], [place] [address] Phone:[phone] Fax : [phone]  Primary Care Physician:  Casilda Carls, MD Primary Gastroenterologist:  Dr. Allen Norris  Pre-Procedure History & Physical: HPI:  Kristen Oneal is a 71 y.o. female is here for an colonoscopy.   Past Medical History:  Diagnosis Date   Arthritis    "everywhere" esp Left knee   Hx of cardiac cath    with stent in 2009   Hypertension    Hypothyroidism     Past Surgical History:  Procedure Laterality Date   BACK SURGERY     BREAST BIOPSY     BUNIONECTOMY     COLONOSCOPY WITH PROPOFOL N/A 12/27/2016   Procedure: COLONOSCOPY WITH PROPOFOL;  Surgeon: Lucilla Lame, MD;  Location: [place];  Service: Endoscopy;  Laterality: N/A;   KNEE SURGERY     POLYPECTOMY N/A 12/27/2016   Procedure: POLYPECTOMY;  Surgeon: Lucilla Lame, MD;  Location: [place];  Service: Endoscopy;  Laterality: N/A;    Prior to Admission medications   Medication Sig Start Date End Date Taking? Authorizing Provider  Calcium Carbonate-Vitamin D 600-400 MG-UNIT tablet Take by mouth.   Yes [provider]  clopidogrel (PLAVIX) 75 MG tablet Take 75 mg by mouth. 10/15/15  Yes [provider]  diclofenac sodium (VOLTAREN) 1 % GEL Apply topically. 02/10/15  Yes [provider]  hydrochlorothiazide (HYDRODIURIL) 25 MG tablet Take 25 mg by mouth. 10/15/15  Yes [provider]  levothyroxine (SYNTHROID) 100 MCG tablet Reported on 01/13/2016 03/10/15  Yes [provider]  metoprolol succinate (TOPROL-XL) 50 MG 24 hr tablet Take by mouth.   Yes [provider]  sodium fluoride (PREVIDENT) 1.1 % GEL dental gel Apply topically. 06/21/15  Yes [provider]  ezetimibe (ZETIA) 10 MG tablet  02/29/16   [provider]  ezetimibe-simvastatin (VYTORIN) 10-20 MG tablet Reported on 01/13/2016 Patient not taking: Reported on 07/05/2022 08/10/14    [provider]  lisinopril (PRINIVIL,ZESTRIL) 5 MG tablet  08/10/14   [provider]  simvastatin (ZOCOR) 20 MG tablet  05/31/16   [provider]  traMADol (ULTRAM) 50 MG tablet Take by mouth. Patient not taking: Reported on 07/05/2022 01/13/16   [provider]    Allergies as of 04/12/2022   (No Known Allergies)    History reviewed. No pertinent family history.  Social History   Socioeconomic History   Marital status: Married    Spouse name: Not on file   Number of children: Not on file   Years of education: Not on file   Highest education level: Not on file  Occupational History   Not on file  Tobacco Use   Smoking status: Never   Smokeless tobacco: Never  Vaping Use   Vaping Use: Never used  Substance and Sexual Activity   Alcohol use: No   Drug use: Never   Sexual activity: Not on file  Other Topics Concern   Not on file  Social History Narrative   Not on file   Social Determinants of Health   Financial Resource Strain: Not on file  Food Insecurity: Not on file  Transportation Needs: Not on file  Physical Activity: Not on file  Stress: Not on file  Social Connections: Not on file  Intimate Partner Violence: Not on file    Review of Systems: See HPI, otherwise negative ROS  Physical Exam: BP 129/81   Pulse 72   Temp (!)  96.6 F (35.9 C) (Temporal)   Resp 14   Ht '5\' 8"'$  (1.727 m)   Wt 78.9 kg   SpO2 96%   BMI 26.46 kg/m  General:   Alert,  pleasant and cooperative in NAD Head:  Normocephalic and atraumatic. Neck:  Supple; no masses or thyromegaly. Lungs:  Clear throughout to auscultation.    Heart:  Regular rate and rhythm. Abdomen:  Soft, nontender and nondistended. Normal bowel sounds, without guarding, and without rebound.   Neurologic:  Alert and  oriented x4;  grossly normal neurologically.  Impression/Plan: Kristen Oneal is here for an colonoscopy to be performed for a history of adenomatous  polyps on 2018    Risks, benefits, limitations, and alternatives regarding  colonoscopy have been reviewed with the patient.  Questions have been answered.  All parties agreeable.   Lucilla Lame, MD  07/13/2022, 9:29 AM

## 2022-07-13 NOTE — Anesthesia Preprocedure Evaluation (Addendum)
Anesthesia Evaluation  Patient identified by MRN, date of birth, ID band Patient awake    Reviewed: Allergy & Precautions, NPO status , Patient's Chart, lab work & pertinent test results  History of Anesthesia Complications Negative for: history of anesthetic complications  Airway Mallampati: III   Neck ROM: Full    Dental  (+) Upper Dentures   Pulmonary neg pulmonary ROS   Pulmonary exam normal breath sounds clear to auscultation       Cardiovascular hypertension, + CAD (s/p stent on Plavix)  Normal cardiovascular exam Rhythm:Regular Rate:Normal     Neuro/Psych negative neurological ROS     GI/Hepatic negative GI ROS,,,  Endo/Other  Hypothyroidism    Renal/GU negative Renal ROS     Musculoskeletal  (+) Arthritis ,    Abdominal   Peds  Hematology negative hematology ROS (+)   Anesthesia Other Findings   Reproductive/Obstetrics                             Anesthesia Physical Anesthesia Plan  ASA: 2  Anesthesia Plan: General   Post-op Pain Management:    Induction: Intravenous  PONV Risk Score and Plan: 3 and Propofol infusion, TIVA and Treatment may vary due to age or medical condition  Airway Management Planned: Natural Airway  Additional Equipment:   Intra-op Plan:   Post-operative Plan:   Informed Consent: I have reviewed the patients History and Physical, chart, labs and discussed the procedure including the risks, benefits and alternatives for the proposed anesthesia with the patient or authorized representative who has indicated his/her understanding and acceptance.       Plan Discussed with: CRNA  Anesthesia Plan Comments: (LMA/GETA backup discussed.  Patient consented for risks of anesthesia including but not limited to:  - adverse reactions to medications - damage to eyes, teeth, lips or other oral mucosa - nerve damage due to positioning  - sore throat or  hoarseness - damage to heart, brain, nerves, lungs, other parts of body or loss of life  Informed patient about role of CRNA in peri- and intra-operative care.  Patient voiced understanding.)        Anesthesia Quick Evaluation

## 2022-07-13 NOTE — Transfer of Care (Signed)
Immediate Anesthesia Transfer of Care Note  Patient: Kristen Oneal  Procedure(s) Performed: COLONOSCOPY WITH PROPOFOL (Rectum)  Patient Location: PACU  Anesthesia Type: General  Level of Consciousness: awake, alert  and patient cooperative  Airway and Oxygen Therapy: Patient Spontanous Breathing and Patient connected to supplemental oxygen  Post-op Assessment: Post-op Vital signs reviewed, Patient's Cardiovascular Status Stable, Respiratory Function Stable, Patent Airway and No signs of Nausea or vomiting  Post-op Vital Signs: Reviewed and stable  Complications: No notable events documented.

## 2022-07-16 ENCOUNTER — Encounter: Payer: Self-pay | Admitting: Gastroenterology

## 2022-10-16 ENCOUNTER — Other Ambulatory Visit (INDEPENDENT_AMBULATORY_CARE_PROVIDER_SITE_OTHER): Payer: Medicare PPO

## 2022-10-16 ENCOUNTER — Other Ambulatory Visit: Payer: Self-pay | Admitting: Internal Medicine

## 2022-10-16 DIAGNOSIS — M8589 Other specified disorders of bone density and structure, multiple sites: Secondary | ICD-10-CM

## 2024-05-05 ENCOUNTER — Ambulatory Visit: Payer: Self-pay | Admitting: Internal Medicine

## 2024-05-05 ENCOUNTER — Encounter: Payer: Self-pay | Admitting: Internal Medicine

## 2024-05-05 ENCOUNTER — Ambulatory Visit: Admitting: Internal Medicine

## 2024-05-05 VITALS — BP 122/76 | HR 65 | Ht 68.0 in | Wt 163.2 lb

## 2024-05-05 DIAGNOSIS — E1169 Type 2 diabetes mellitus with other specified complication: Secondary | ICD-10-CM | POA: Insufficient documentation

## 2024-05-05 DIAGNOSIS — E038 Other specified hypothyroidism: Secondary | ICD-10-CM | POA: Diagnosis not present

## 2024-05-05 DIAGNOSIS — I152 Hypertension secondary to endocrine disorders: Secondary | ICD-10-CM

## 2024-05-05 DIAGNOSIS — E782 Mixed hyperlipidemia: Secondary | ICD-10-CM

## 2024-05-05 DIAGNOSIS — E1159 Type 2 diabetes mellitus with other circulatory complications: Secondary | ICD-10-CM | POA: Diagnosis not present

## 2024-05-05 DIAGNOSIS — E559 Vitamin D deficiency, unspecified: Secondary | ICD-10-CM | POA: Insufficient documentation

## 2024-05-05 DIAGNOSIS — E119 Type 2 diabetes mellitus without complications: Secondary | ICD-10-CM | POA: Insufficient documentation

## 2024-05-05 LAB — POC CREATINE & ALBUMIN,URINE
Albumin/Creatinine Ratio, Urine, POC: <30
Creatinine, POC: 100 mg/dL
Microalbumin Ur, POC: 30 mg/L

## 2024-05-05 LAB — POCT CBG (FASTING - GLUCOSE)-MANUAL ENTRY: Glucose Fasting, POC: 120 mg/dL — AB (ref 70–99)

## 2024-05-05 NOTE — Progress Notes (Signed)
 New Patient Office Visit  Subjective   Patient ID: Kristen Oneal, female    DOB: 1951/07/29  Age: 73 y.o. MRN: [id number]  CC:  Chief Complaint  Patient presents with   Establish Care    NPE    HPI Kristen Oneal presents to establish care Previous Primary Care provider/office:   she does not have additional concerns to discuss today.   Patient is here to establish care with us . She is doing well and has no new complaints today. She has a medical history of DM, HTN, hyper lipidemia, uterine cancer with hysterectomy, knee and back surgeries, and s/p PTCA x1. She reports her back always bothers her but she has had multiple fusion surgeries and sees her specialist regularly and her pain is well controlled with exercise and Gabapentin. Her colonoscopy was in 2023, DEXA 2024, mammogram 2024. Will order referral to get mammogram completed on schedule. While reviewing medication list patient had reported not being on Lsinopril due to developing a dry cough when she had previously taken it. Reaction added to her allergies list for ace inhibitors. Encouraged patient to restart her calcium and vitamin D  supplements due to her osteopenia. Patient states she is bad about remembering to take them regularly but will work on in.  Patient reports her fasting blood sugars at home range from 110-130s. She tries to eat a healthy diet and exercise as tolerated. Her last HbgA1c in 11/2023 was 7.2%. Will check fasting labs today and a urine alb/creat ratio.  Denies headaches, chest pain, shortness of breath, nausea, vomiting, diarrhea/constipation at this time.     Outpatient Encounter Medications as of 05/05/2024  Medication Sig   amLODipine (NORVASC) 5 MG tablet Take 5 mg by mouth daily.   clopidogrel (PLAVIX) 75 MG tablet Take 75 mg by mouth.   diclofenac sodium (VOLTAREN) 1 % GEL Apply topically.   FARXIGA 10 MG TABS tablet Take 10 mg by mouth daily.   gabapentin (NEURONTIN) 300 MG capsule  Take 300 mg by mouth 2 (two) times daily.   hydrochlorothiazide (HYDRODIURIL) 25 MG tablet Take 25 mg by mouth.   levothyroxine (SYNTHROID) 100 MCG tablet Reported on 01/13/2016   metFORMIN (GLUCOPHAGE) 500 MG tablet Take 500 mg by mouth 2 (two) times daily with a meal.   metoprolol succinate (TOPROL-XL) 25 MG 24 hr tablet Take 25 mg by mouth daily.   metoprolol succinate (TOPROL-XL) 50 MG 24 hr tablet Take by mouth.   rosuvastatin (CRESTOR) 10 MG tablet Take 10 mg by mouth at bedtime.   sodium fluoride (PREVIDENT) 1.1 % GEL dental gel Apply topically.   Calcium Carbonate-Vitamin D  600-400 MG-UNIT tablet Take by mouth. (Patient not taking: Reported on 05/05/2024)   traMADol (ULTRAM) 50 MG tablet Take by mouth. (Patient not taking: Reported on 05/05/2024)   [DISCONTINUED] ezetimibe (ZETIA) 10 MG tablet  (Patient not taking: Reported on 05/05/2024)   [DISCONTINUED] ezetimibe-simvastatin (VYTORIN) 10-20 MG tablet Reported on 01/13/2016 (Patient not taking: Reported on 05/05/2024)   [DISCONTINUED] lisinopril (PRINIVIL,ZESTRIL) 5 MG tablet  (Patient not taking: Reported on 05/05/2024)   [DISCONTINUED] simvastatin (ZOCOR) 20 MG tablet  (Patient not taking: Reported on 05/05/2024)   No facility-administered encounter medications on file as of 05/05/2024.    Past Medical History:  Diagnosis Date   Arthritis    everywhere esp Left knee   Hx of cardiac cath    with stent in 2009   Hypertension    Hypothyroidism     Past Surgical History:  Procedure Laterality Date   BACK SURGERY     BREAST BIOPSY     BUNIONECTOMY     COLONOSCOPY WITH PROPOFOL  N/A 12/27/2016   Procedure: COLONOSCOPY WITH PROPOFOL ;  Surgeon: Rogelia Copping, MD;  Location: [facility];  Service: Endoscopy;  Laterality: N/A;   COLONOSCOPY WITH PROPOFOL  N/A 07/13/2022   Procedure: COLONOSCOPY WITH PROPOFOL ;  Surgeon: Copping Rogelia, MD;  Location: [facility];  Service: Endoscopy;  Laterality: N/A;   KNEE SURGERY     POLYPECTOMY  N/A 12/27/2016   Procedure: POLYPECTOMY;  Surgeon: Rogelia Copping, MD;  Location: [facility];  Service: Endoscopy;  Laterality: N/A;    History reviewed. No pertinent family history.  Social History   Socioeconomic History   Marital status: Married    Spouse name: Not on file   Number of children: Not on file   Years of education: Not on file   Highest education level: Not on file  Occupational History   Not on file  Tobacco Use   Smoking status: Never   Smokeless tobacco: Never  Vaping Use   Vaping status: Never Used  Substance and Sexual Activity   Alcohol use: No   Drug use: Never   Sexual activity: Not on file  Other Topics Concern   Not on file  Social History Narrative   Not on file   Social Drivers of Health   Financial Resource Strain: Not on file  Food Insecurity: Not on file  Transportation Needs: Not on file  Physical Activity: Not on file  Stress: Not on file  Social Connections: Not on file  Intimate Partner Violence: Not on file    Review of Systems  Constitutional: Negative.  Negative for diaphoresis, fever, malaise/fatigue and weight loss.  HENT: Negative.  Negative for sore throat and tinnitus.   Eyes: Negative.  Negative for blurred vision.  Respiratory: Negative.  Negative for cough and shortness of breath.   Cardiovascular: Negative.  Negative for chest pain, palpitations and leg swelling.  Gastrointestinal: Negative.  Negative for abdominal pain, constipation, diarrhea, heartburn, nausea and vomiting.  Genitourinary: Negative.  Negative for dysuria and flank pain.  Musculoskeletal: Negative.  Negative for joint pain and myalgias.  Skin: Negative.   Neurological: Negative.  Negative for dizziness, tingling, tremors, weakness and headaches.  Endo/Heme/Allergies: Negative.   Psychiatric/Behavioral: Negative.  Negative for depression and suicidal ideas. The patient is not nervous/anxious.         Objective   BP 122/76   Pulse 65   Ht  5' 8 (1.727 m)   Wt 163 lb 3.2 oz (74 kg)   SpO2 97%   BMI 24.81 kg/m   Physical Exam Vitals and nursing note reviewed.  Constitutional:      Appearance: Normal appearance.  HENT:     Head: Normocephalic and atraumatic.     Nose: Nose normal.     Mouth/Throat:     Mouth: Mucous membranes are moist.     Pharynx: Oropharynx is clear.  Eyes:     Conjunctiva/sclera: Conjunctivae normal.     Pupils: Pupils are equal, round, and reactive to light.  Cardiovascular:     Rate and Rhythm: Normal rate and regular rhythm.     Pulses: Normal pulses.     Heart sounds: Normal heart sounds. No murmur heard. Pulmonary:     Effort: Pulmonary effort is normal.     Breath sounds: Normal breath sounds. No wheezing.  Abdominal:     General: Bowel sounds are  normal.     Palpations: Abdomen is soft.     Tenderness: There is no abdominal tenderness. There is no right CVA tenderness or left CVA tenderness.  Musculoskeletal:        General: Normal range of motion.     Cervical back: Normal range of motion.     Right lower leg: No edema.     Left lower leg: No edema.  Skin:    General: Skin is warm and dry.  Neurological:     General: No focal deficit present.     Mental Status: She is alert and oriented to person, place, and time.  Psychiatric:        Mood and Affect: Mood normal.        Behavior: Behavior normal.        Assessment & Plan:  Continue taking medications as prescribed. Restart calcium and vitamin D  supplementation. Will collect fasting labs today and follow up with patient on results. Problem List Items Addressed This Visit     Other specified hypothyroidism   Relevant Medications   metoprolol succinate (TOPROL-XL) 25 MG 24 hr tablet   Other Relevant Orders   TSH+T4F+T3Free   Type 2 diabetes mellitus without complication, without long-term current use of insulin (HCC) - Primary   Relevant Medications   FARXIGA 10 MG TABS tablet   metFORMIN (GLUCOPHAGE) 500 MG tablet    rosuvastatin (CRESTOR) 10 MG tablet   Other Relevant Orders   POCT CBG (Fasting - Glucose) (Completed)   Hemoglobin A1c   POC CREATINE & ALBUMIN,URINE (Completed)   Hypertension associated with diabetes (HCC)   Relevant Medications   amLODipine (NORVASC) 5 MG tablet   FARXIGA 10 MG TABS tablet   metFORMIN (GLUCOPHAGE) 500 MG tablet   rosuvastatin (CRESTOR) 10 MG tablet   metoprolol succinate (TOPROL-XL) 25 MG 24 hr tablet   Other Relevant Orders   CBC with Diff   CMP14+EGFR   Combined hyperlipidemia associated with type 2 diabetes mellitus (HCC)   Relevant Medications   amLODipine (NORVASC) 5 MG tablet   FARXIGA 10 MG TABS tablet   metFORMIN (GLUCOPHAGE) 500 MG tablet   rosuvastatin (CRESTOR) 10 MG tablet   metoprolol succinate (TOPROL-XL) 25 MG 24 hr tablet   Vitamin D  deficiency   Relevant Orders   Vitamin D  (25 hydroxy)    Return in about 3 months (around 08/04/2024).   Total time spent: 30 minutes  FERNAND FREDY RAMAN, MD  05/05/2024   This document may have been prepared by [facility] Voice Recognition software and as such may include unintentional dictation errors.

## 2024-05-06 LAB — CBC WITH DIFFERENTIAL/PLATELET
Basophils Absolute: 0.1 x10E3/uL (ref 0.0–0.2)
Basos: 1 %
EOS (ABSOLUTE): 0.2 x10E3/uL (ref 0.0–0.4)
Eos: 3 %
Hematocrit: 42.8 % (ref 34.0–46.6)
Hemoglobin: 13.5 g/dL (ref 11.1–15.9)
Immature Grans (Abs): 0 x10E3/uL (ref 0.0–0.1)
Immature Granulocytes: 0 %
Lymphocytes Absolute: 3 x10E3/uL (ref 0.7–3.1)
Lymphs: 39 %
MCH: 26.1 pg — ABNORMAL LOW (ref 26.6–33.0)
MCHC: 31.5 g/dL (ref 31.5–35.7)
MCV: 83 fL (ref 79–97)
Monocytes Absolute: 0.6 x10E3/uL (ref 0.1–0.9)
Monocytes: 8 %
Neutrophils Absolute: 3.8 x10E3/uL (ref 1.4–7.0)
Neutrophils: 49 %
Platelets: 301 x10E3/uL (ref 150–450)
RBC: 5.18 x10E6/uL (ref 3.77–5.28)
RDW: 15.9 % — ABNORMAL HIGH (ref 11.7–15.4)
WBC: 7.7 x10E3/uL (ref 3.4–10.8)

## 2024-05-06 LAB — CMP14+EGFR
ALT: 12 IU/L (ref 0–32)
AST: 11 IU/L (ref 0–40)
Albumin: 4.3 g/dL (ref 3.8–4.8)
Alkaline Phosphatase: 123 IU/L — ABNORMAL HIGH (ref 44–121)
BUN/Creatinine Ratio: 28 (ref 12–28)
BUN: 19 mg/dL (ref 8–27)
Bilirubin Total: 0.3 mg/dL (ref 0.0–1.2)
CO2: 27 mmol/L (ref 20–29)
Calcium: 9.4 mg/dL (ref 8.7–10.3)
Chloride: 101 mmol/L (ref 96–106)
Creatinine, Ser: 0.69 mg/dL (ref 0.57–1.00)
Globulin, Total: 2.5 g/dL (ref 1.5–4.5)
Glucose: 107 mg/dL — ABNORMAL HIGH (ref 70–99)
Potassium: 3.8 mmol/L (ref 3.5–5.2)
Sodium: 142 mmol/L (ref 134–144)
Total Protein: 6.8 g/dL (ref 6.0–8.5)
eGFR: 90 mL/min/1.73 (ref >59)

## 2024-05-06 LAB — TSH+T4F+T3FREE
Free T4: 1.25 ng/dL (ref 0.82–1.77)
T3, Free: 2.5 pg/mL (ref 2.0–4.4)
TSH: 2.35 u[IU]/mL (ref 0.450–4.500)

## 2024-05-06 LAB — HEMOGLOBIN A1C
Est. average glucose Bld gHb Est-mCnc: 146 mg/dL
Hgb A1c MFr Bld: 6.7 % — ABNORMAL HIGH (ref 4.8–5.6)

## 2024-05-06 LAB — LIPID PANEL W/O CHOL/HDL RATIO
Cholesterol, Total: 172 mg/dL (ref 100–199)
HDL: 62 mg/dL (ref >39)
LDL Chol Calc (NIH): 96 mg/dL (ref 0–99)
Triglycerides: 72 mg/dL (ref 0–149)
VLDL Cholesterol Cal: 14 mg/dL (ref 5–40)

## 2024-05-06 LAB — VITAMIN D 25 HYDROXY (VIT D DEFICIENCY, FRACTURES): Vit D, 25-Hydroxy: 38.1 ng/mL (ref 30.0–100.0)

## 2024-05-12 NOTE — Progress Notes (Signed)
 Patient notified

## 2024-05-20 ENCOUNTER — Other Ambulatory Visit: Payer: Self-pay | Admitting: Internal Medicine

## 2024-05-26 ENCOUNTER — Encounter: Payer: Self-pay | Admitting: Internal Medicine

## 2024-06-02 ENCOUNTER — Other Ambulatory Visit: Payer: Self-pay

## 2024-06-03 ENCOUNTER — Ambulatory Visit: Admitting: Family

## 2024-06-03 ENCOUNTER — Encounter: Payer: Self-pay | Admitting: Family

## 2024-06-03 VITALS — BP 134/80 | HR 60 | Ht 67.0 in | Wt 167.4 lb

## 2024-06-03 DIAGNOSIS — E1159 Type 2 diabetes mellitus with other circulatory complications: Secondary | ICD-10-CM

## 2024-06-03 DIAGNOSIS — B09 Unspecified viral infection characterized by skin and mucous membrane lesions: Secondary | ICD-10-CM | POA: Insufficient documentation

## 2024-06-03 DIAGNOSIS — Z0130 Encounter for examination of blood pressure without abnormal findings: Secondary | ICD-10-CM

## 2024-06-03 MED ORDER — HYDROCHLOROTHIAZIDE 25 MG PO TABS: 25.0000 mg | ORAL_TABLET | Freq: Every day | ORAL | 3 refills | Status: DC

## 2024-06-03 MED ORDER — LEVOTHYROXINE SODIUM 100 MCG PO TABS: 100.0000 ug | ORAL_TABLET | Freq: Every day | ORAL | 3 refills | Status: DC

## 2024-06-03 NOTE — Progress Notes (Signed)
 Established Patient Office Visit  Subjective:  Patient ID: Kristen Oneal, female    DOB: 1950/11/11  Age: 73 y.o. MRN: [id number]  Chief Complaint  Patient presents with   Acute Visit    Possible shingles    Patient is here today for her acute visit.  She has been feeling fairly well since last appointment.   She does have additional concerns to discuss today. Two erythemous round patches on right buttocks that is itching does not hurt or burn; states they appeared yesterday morning. One is larger than the other and look fluid filled. Patient is unsure if it was shingles. Patient is already on Gabapentin for pain control. Will test for shingles as patient is scheduled for shingles shot in 2 weeks before starting valtrex. Labs are not due today.  She needs refills. They have been sent by another provider earlier today.  I have reviewed her active problem list, medication list, allergies, family history, social history, health maintenance, notes from last encounter, lab results for her appointment today.      No other concerns at this time.   Past Medical History:  Diagnosis Date   Arthritis    everywhere esp Left knee   Hx of cardiac cath    with stent in 2009   Hypertension    Hypothyroidism     Past Surgical History:  Procedure Laterality Date   BACK SURGERY     BREAST BIOPSY     BUNIONECTOMY     COLONOSCOPY WITH PROPOFOL  N/A 12/27/2016   Procedure: COLONOSCOPY WITH PROPOFOL ;  Surgeon: Rogelia Copping, MD;  Location: [facility];  Service: Endoscopy;  Laterality: N/A;   COLONOSCOPY WITH PROPOFOL  N/A 07/13/2022   Procedure: COLONOSCOPY WITH PROPOFOL ;  Surgeon: Copping Rogelia, MD;  Location: [facility];  Service: Endoscopy;  Laterality: N/A;   KNEE SURGERY     POLYPECTOMY N/A 12/27/2016   Procedure: POLYPECTOMY;  Surgeon: Rogelia Copping, MD;  Location: [facility];  Service: Endoscopy;  Laterality: N/A;    Social History   Socioeconomic History    Marital status: Married    Spouse name: Not on file   Number of children: Not on file   Years of education: Not on file   Highest education level: Not on file  Occupational History   Not on file  Tobacco Use   Smoking status: Never   Smokeless tobacco: Never  Vaping Use   Vaping status: Never Used  Substance and Sexual Activity   Alcohol use: No   Drug use: Never   Sexual activity: Not on file  Other Topics Concern   Not on file  Social History Narrative   Not on file   Social Drivers of Health   Financial Resource Strain: Not on file  Food Insecurity: Not on file  Transportation Needs: Not on file  Physical Activity: Not on file  Stress: Not on file  Social Connections: Not on file  Intimate Partner Violence: Not on file    No family history on file.  Allergies  Allergen Reactions   Ace Inhibitors Cough    Review of Systems  Constitutional:  Negative for malaise/fatigue.  HENT: Negative.    Eyes:  Negative for blurred vision and pain.  Respiratory:  Negative for cough and shortness of breath.   Cardiovascular:  Negative for chest pain, palpitations, claudication and leg swelling.  Gastrointestinal:  Negative for abdominal pain, blood in stool, constipation, diarrhea, nausea and vomiting.  Genitourinary:  Negative for dysuria,  frequency and urgency.  Musculoskeletal: Negative.   Skin:  Positive for itching and rash (right buttocks).  Neurological:  Negative for dizziness, tingling, sensory change and headaches.  Endo/Heme/Allergies: Negative.   Psychiatric/Behavioral: Negative.         Objective:   BP 134/80   Pulse 60   Ht 5' 7 (1.702 m)   Wt 167 lb 6.4 oz (75.9 kg)   SpO2 95%   BMI 26.22 kg/m   Vitals:   06/03/24 1308  BP: 134/80  Pulse: 60  Height: 5' 7 (1.702 m)  Weight: 167 lb 6.4 oz (75.9 kg)  SpO2: 95%  BMI (Calculated): 26.21    Physical Exam Vitals and nursing note reviewed.  Constitutional:      Appearance: Normal  appearance.  HENT:     Head: Normocephalic.  Eyes:     Extraocular Movements: Extraocular movements intact.     Pupils: Pupils are equal, round, and reactive to light.  Cardiovascular:     Rate and Rhythm: Normal rate and regular rhythm.     Pulses: Normal pulses.     Heart sounds: Normal heart sounds. No murmur heard. Pulmonary:     Effort: Pulmonary effort is normal. No respiratory distress.     Breath sounds: Normal breath sounds.  Abdominal:     General: There is no distension.     Tenderness: There is no abdominal tenderness.  Musculoskeletal:        General: No tenderness. Normal range of motion.     Cervical back: Normal range of motion and neck supple.     Right lower leg: No edema.     Left lower leg: No edema.  Skin:    General: Skin is warm and dry.     Coloration: Skin is not jaundiced.     Findings: Erythema, lesion and rash present. Rash is vesicular.  Neurological:     General: No focal deficit present.     Mental Status: She is alert and oriented to person, place, and time.  Psychiatric:        Mood and Affect: Mood normal.        Speech: Speech normal.        Behavior: Behavior is cooperative.        Cognition and Memory: Memory is not impaired.      No results found for any visits on 06/03/24.  Recent Results (from the past 2160 hours)  POCT CBG (Fasting - Glucose)     Status: Abnormal   Collection Time: 05/05/24  9:27 AM  Result Value Ref Range   Glucose Fasting, POC 120 (A) 70 - 99 mg/dL  CBC with Diff     Status: Abnormal   Collection Time: 05/05/24 10:13 AM  Result Value Ref Range   WBC 7.7 3.4 - 10.8 x10E3/uL   RBC 5.18 3.77 - 5.28 x10E6/uL   Hemoglobin 13.5 11.1 - 15.9 g/dL   Hematocrit 57.1 65.9 - 46.6 %   MCV 83 79 - 97 fL   MCH 26.1 (L) 26.6 - 33.0 pg   MCHC 31.5 31.5 - 35.7 g/dL   RDW 84.0 (H) 88.2 - 84.5 %   Platelets 301 150 - 450 x10E3/uL   Neutrophils 49 Not Estab. %   Lymphs 39 Not Estab. %   Monocytes 8 Not Estab. %   Eos 3  Not Estab. %   Basos 1 Not Estab. %   Neutrophils Absolute 3.8 1.4 - 7.0 x10E3/uL   Lymphocytes Absolute 3.0 0.7 -  3.1 x10E3/uL   Monocytes Absolute 0.6 0.1 - 0.9 x10E3/uL   EOS (ABSOLUTE) 0.2 0.0 - 0.4 x10E3/uL   Basophils Absolute 0.1 0.0 - 0.2 x10E3/uL   Immature Granulocytes 0 Not Estab. %   Immature Grans (Abs) 0.0 0.0 - 0.1 x10E3/uL  CMP14+EGFR     Status: Abnormal   Collection Time: 05/05/24 10:13 AM  Result Value Ref Range   Glucose 107 (H) 70 - 99 mg/dL   BUN 19 8 - 27 mg/dL   Creatinine, Ser 9.30 0.57 - 1.00 mg/dL   eGFR 90 >40 fO/fpw/8.26   BUN/Creatinine Ratio 28 12 - 28   Sodium 142 134 - 144 mmol/L   Potassium 3.8 3.5 - 5.2 mmol/L   Chloride 101 96 - 106 mmol/L   CO2 27 20 - 29 mmol/L   Calcium 9.4 8.7 - 10.3 mg/dL   Total Protein 6.8 6.0 - 8.5 g/dL   Albumin 4.3 3.8 - 4.8 g/dL   Globulin, Total 2.5 1.5 - 4.5 g/dL   Bilirubin Total 0.3 0.0 - 1.2 mg/dL   Alkaline Phosphatase 123 (H) 44 - 121 IU/L    Comment: **Effective May 11, 2024 Alkaline Phosphatase**   reference interval will be changing to:              Age                Female          Female           0 -  5 days         47 - 127       47 - 127           6 - 10 days         29 - 242       29 - 242          11 - 20 days        109 - 357      109 - 357          21 - 30 days         94 - 494       94 - 494           1 -  2 months      149 - 539      149 - 539           3 -  6 months      131 - 452      131 - 452           7 - 11 months      117 - 401      117 - 401   12 months -  6 years       158 - 369      158 - 369           7 - 12 years       150 - 409      150 - 409               13 years       156 - 435       78 - 227               14 years       114 - 375       64 - 161  15 years        88 - 279       56 - 134               16 years        74 - 207       51 - 121               17 years        63 - 161       47 - 113          18 - 20 years        51 - 125       42 - 106           21 - 50 years         47 - 123       41 - 116          51 - 80 years        49 - 135       51 - 125              >80 years        48 - 129       48 - 129    AST 11 0 - 40 IU/L   ALT 12 0 - 32 IU/L  Lipid Panel w/o Chol/HDL Ratio     Status: None   Collection Time: 05/05/24 10:13 AM  Result Value Ref Range   Cholesterol, Total 172 100 - 199 mg/dL   Triglycerides 72 0 - 149 mg/dL   HDL 62 >60 mg/dL   VLDL Cholesterol Cal 14 5 - 40 mg/dL   LDL Chol Calc (NIH) 96 0 - 99 mg/dL  Hemoglobin J8r     Status: Abnormal   Collection Time: 05/05/24 10:13 AM  Result Value Ref Range   Hgb A1c MFr Bld 6.7 (H) 4.8 - 5.6 %    Comment:          Prediabetes: 5.7 - 6.4          Diabetes: >6.4          Glycemic control for adults with diabetes: <7.0    Est. average glucose Bld gHb Est-mCnc 146 mg/dL  Vitamin D  (25 hydroxy)     Status: None   Collection Time: 05/05/24 10:13 AM  Result Value Ref Range   Vit D, 25-Hydroxy 38.1 30.0 - 100.0 ng/mL    Comment: Vitamin D  deficiency has been defined by the Institute of Medicine and an Endocrine Society practice guideline as a level of serum 25-OH vitamin D  less than 20 ng/mL (1,2). The Endocrine Society went on to further define vitamin D  insufficiency as a level between 21 and 29 ng/mL (2). 1. IOM (Institute of Medicine). 2010. Dietary reference    intakes for calcium and D. [place]: The    [employer]. 2. Holick MF, Binkley [place], Bischoff-Ferrari HA, et al.    Evaluation, treatment, and prevention of vitamin D     deficiency: an Endocrine Society clinical practice    guideline. JCEM. 2011 Jul; 96(7):1911-30.   TSH+T4F+T3Free     Status: None   Collection Time: 05/05/24 10:13 AM  Result Value Ref Range   TSH 2.350 0.450 - 4.500 uIU/mL   T3, Free 2.5 2.0 - 4.4 pg/mL   Free T4 1.25 0.82 - 1.77 ng/dL  POC CREATINE & ALBUMIN,URINE  Status: Normal   Collection Time: 05/05/24 10:17 AM  Result Value Ref Range   Microalbumin Ur, POC 30  mg/L   Creatinine, POC 100 mg/dL   Albumin/Creatinine Ratio, Urine, POC <30        Assessment & Plan:   Assessment & Plan Unspecified viral infection characterized by skin and mucous membrane lesions - Swab and culture to r/o shingles. - Will order valtrex based off rapid results. - Continue taking Gabapentin as prescribed.    No follow-ups on file.   Total time spent: 20 minutes  Oddis DELENA Cain, FNP  06/03/2024   This document may have been prepared by [facility] Voice Recognition software and as such may include unintentional dictation errors.

## 2024-06-03 NOTE — Assessment & Plan Note (Signed)
-   Swab and culture to r/o shingles. - Will order valtrex based off rapid results. - Continue taking Gabapentin as prescribed.

## 2024-06-08 LAB — HERPES CULTURE, RAPID

## 2024-06-11 ENCOUNTER — Ambulatory Visit: Payer: Self-pay

## 2024-07-07 DIAGNOSIS — H40013 Open angle with borderline findings, low risk, bilateral: Secondary | ICD-10-CM | POA: Diagnosis not present

## 2024-07-07 DIAGNOSIS — H25013 Cortical age-related cataract, bilateral: Secondary | ICD-10-CM | POA: Diagnosis not present

## 2024-07-20 ENCOUNTER — Other Ambulatory Visit: Payer: Self-pay | Admitting: Internal Medicine

## 2024-07-20 MED ORDER — FARXIGA 10 MG PO TABS: 10.0000 mg | ORAL_TABLET | Freq: Every day | ORAL | 3 refills | Status: AC

## 2024-07-20 MED ORDER — HYDROCHLOROTHIAZIDE 25 MG PO TABS: 25.0000 mg | ORAL_TABLET | Freq: Every day | ORAL | 3 refills | Status: AC

## 2024-07-22 ENCOUNTER — Other Ambulatory Visit: Payer: Self-pay

## 2024-07-24 MED ORDER — METOPROLOL SUCCINATE ER 25 MG PO TB24: 25.0000 mg | ORAL_TABLET | Freq: Every day | ORAL | 3 refills | Status: AC

## 2024-07-27 ENCOUNTER — Other Ambulatory Visit: Payer: Self-pay | Admitting: Internal Medicine

## 2024-07-27 DIAGNOSIS — H16102 Unspecified superficial keratitis, left eye: Secondary | ICD-10-CM | POA: Diagnosis not present

## 2024-08-04 ENCOUNTER — Ambulatory Visit: Admitting: Internal Medicine

## 2024-08-11 ENCOUNTER — Ambulatory Visit: Admitting: Internal Medicine

## 2024-08-11 ENCOUNTER — Encounter: Payer: Self-pay | Admitting: Internal Medicine

## 2024-08-11 ENCOUNTER — Ambulatory Visit: Payer: Self-pay | Admitting: Internal Medicine

## 2024-08-11 ENCOUNTER — Ambulatory Visit
Admission: RE | Admit: 2024-08-11 | Discharge: 2024-08-11 | Disposition: A | Attending: Internal Medicine | Admitting: Internal Medicine

## 2024-08-11 ENCOUNTER — Ambulatory Visit
Admission: RE | Admit: 2024-08-11 | Discharge: 2024-08-11 | Disposition: A | Source: Ambulatory Visit | Attending: Internal Medicine | Admitting: Internal Medicine

## 2024-08-11 VITALS — BP 110/76 | HR 65 | Ht 67.0 in | Wt 168.4 lb

## 2024-08-11 DIAGNOSIS — Z13820 Encounter for screening for osteoporosis: Secondary | ICD-10-CM | POA: Insufficient documentation

## 2024-08-11 DIAGNOSIS — E559 Vitamin D deficiency, unspecified: Secondary | ICD-10-CM

## 2024-08-11 DIAGNOSIS — E663 Overweight: Secondary | ICD-10-CM | POA: Insufficient documentation

## 2024-08-11 DIAGNOSIS — M159 Polyosteoarthritis, unspecified: Secondary | ICD-10-CM | POA: Diagnosis present

## 2024-08-11 DIAGNOSIS — E782 Mixed hyperlipidemia: Secondary | ICD-10-CM | POA: Diagnosis not present

## 2024-08-11 DIAGNOSIS — E1165 Type 2 diabetes mellitus with hyperglycemia: Secondary | ICD-10-CM | POA: Diagnosis not present

## 2024-08-11 DIAGNOSIS — E1169 Type 2 diabetes mellitus with other specified complication: Secondary | ICD-10-CM | POA: Insufficient documentation

## 2024-08-11 DIAGNOSIS — E038 Other specified hypothyroidism: Secondary | ICD-10-CM

## 2024-08-11 DIAGNOSIS — I152 Hypertension secondary to endocrine disorders: Secondary | ICD-10-CM

## 2024-08-11 DIAGNOSIS — E1159 Type 2 diabetes mellitus with other circulatory complications: Secondary | ICD-10-CM | POA: Diagnosis not present

## 2024-08-11 DIAGNOSIS — E119 Type 2 diabetes mellitus without complications: Secondary | ICD-10-CM

## 2024-08-11 DIAGNOSIS — M19041 Primary osteoarthritis, right hand: Secondary | ICD-10-CM

## 2024-08-11 LAB — POCT CBG (FASTING - GLUCOSE)-MANUAL ENTRY: Glucose Fasting, POC: 123 mg/dL — AB (ref 70–99)

## 2024-08-11 NOTE — Progress Notes (Signed)
 Established Patient Office Visit  Subjective:  Patient ID: Kristen Oneal, female    DOB: 1951-06-05  Age: 73 y.o. MRN: [id number]  Chief Complaint  Patient presents with   Follow-up    3 month follow up    Patient is here today for follow up. She states she is feeling well today and has no new concerns.  She states she still has her arthritic aches and pains but does not take much for it. She is on gabapentin and reports taking that only once a day with additional tylenol  arthritis as needed. Patient has not had arthritis labs or xrays completed that she can recall. She reports her daughter has RA. Will order hand xrays and arthritis panel onto routine blood work.  She reports rash on her right buttocks went away after doing warm compresses.  She has gotten her first shingles shot and is for second shingles shot in a few months.  She reports having Medicare AWV completed this year. She is unsure of the date it was completed.  Patient had last colonoscopy 2023; will do cologuard in 2026. She had mammogram completed 04/2024.  Last DEXA scan 09/2022.    No other concerns at this time.   Past Medical History:  Diagnosis Date   Arthritis    everywhere esp Left knee   Hx of cardiac cath    with stent in 2009   Hypertension    Hypothyroidism     Past Surgical History:  Procedure Laterality Date   BACK SURGERY     BREAST BIOPSY     BUNIONECTOMY     COLONOSCOPY WITH PROPOFOL  N/A 12/27/2016   Procedure: COLONOSCOPY WITH PROPOFOL ;  Surgeon: Rogelia Copping, MD;  Location: [facility];  Service: Endoscopy;  Laterality: N/A;   COLONOSCOPY WITH PROPOFOL  N/A 07/13/2022   Procedure: COLONOSCOPY WITH PROPOFOL ;  Surgeon: Copping Rogelia, MD;  Location: [facility];  Service: Endoscopy;  Laterality: N/A;   KNEE SURGERY     POLYPECTOMY N/A 12/27/2016   Procedure: POLYPECTOMY;  Surgeon: Rogelia Copping, MD;  Location: [facility];  Service: Endoscopy;  Laterality: N/A;     Social History   Socioeconomic History   Marital status: Married    Spouse name: Not on file   Number of children: Not on file   Years of education: Not on file   Highest education level: Not on file  Occupational History   Not on file  Tobacco Use   Smoking status: Never   Smokeless tobacco: Never  Vaping Use   Vaping status: Never Used  Substance and Sexual Activity   Alcohol use: No   Drug use: Never   Sexual activity: Not on file  Other Topics Concern   Not on file  Social History Narrative   Not on file   Social Drivers of Health   Tobacco Use: Low Risk (08/11/2024)   Patient History    Smoking Tobacco Use: Never    Smokeless Tobacco Use: Never    Passive Exposure: Not on file  Financial Resource Strain: Not on file  Food Insecurity: Not on file  Transportation Needs: Not on file  Physical Activity: Not on file  Stress: Not on file  Social Connections: Not on file  Intimate Partner Violence: Not on file  Depression (PHQ2-9): Low Risk (05/05/2024)   Depression (PHQ2-9)    PHQ-2 Score: 1  Alcohol Screen: Not on file  Housing: Unknown (04/05/2024)   Received [facility]  Epic    At any time in the past 12 months, were you homeless or living in a shelter (including now)?: No    Number of Times Moved in the Last Year: Not on file    Unable to Pay for Housing in the Last Year: Not on file  Utilities: Not on file  Health Literacy: Not on file    History reviewed. No pertinent family history.  Allergies[1]  Show/hide medication list[2]  Review of Systems  Constitutional: Negative.  Negative for chills, fever and malaise/fatigue.  HENT: Negative.  Negative for congestion and sore throat.   Eyes: Negative.  Negative for blurred vision and pain.  Respiratory: Negative.  Negative for cough and shortness of breath.   Cardiovascular: Negative.  Negative for chest pain, palpitations and leg swelling.  Gastrointestinal: Negative.   Negative for abdominal pain, blood in stool, constipation, diarrhea, heartburn, melena, nausea and vomiting.  Genitourinary: Negative.  Negative for dysuria, flank pain, frequency and urgency.  Musculoskeletal:  Positive for joint pain. Negative for myalgias.  Skin: Negative.   Neurological: Negative.  Negative for dizziness, tingling, sensory change, weakness and headaches.  Endo/Heme/Allergies: Negative.   Psychiatric/Behavioral: Negative.  Negative for depression and suicidal ideas. The patient is not nervous/anxious.        Objective:   BP 110/76   Pulse 65   Ht 5' 7 (1.702 m)   Wt 168 lb 6.4 oz (76.4 kg)   SpO2 98%   BMI 26.38 kg/m   Vitals:   08/11/24 1043  BP: 110/76  Pulse: 65  Height: 5' 7 (1.702 m)  Weight: 168 lb 6.4 oz (76.4 kg)  SpO2: 98%  BMI (Calculated): 26.37    Physical Exam Vitals and nursing note reviewed.  Constitutional:      Appearance: Normal appearance.  HENT:     Head: Normocephalic and atraumatic.     Nose: Nose normal.     Mouth/Throat:     Mouth: Mucous membranes are moist.     Pharynx: Oropharynx is clear.  Eyes:     Conjunctiva/sclera: Conjunctivae normal.     Pupils: Pupils are equal, round, and reactive to light.  Cardiovascular:     Rate and Rhythm: Normal rate and regular rhythm.     Pulses: Normal pulses.     Heart sounds: Normal heart sounds. No murmur heard. Pulmonary:     Effort: Pulmonary effort is normal.     Breath sounds: Normal breath sounds. No wheezing.  Abdominal:     General: Bowel sounds are normal.     Palpations: Abdomen is soft.     Tenderness: There is no abdominal tenderness. There is no right CVA tenderness or left CVA tenderness.  Musculoskeletal:        General: Normal range of motion.     Cervical back: Normal range of motion.     Right lower leg: No edema.     Left lower leg: No edema.  Skin:    General: Skin is warm and dry.  Neurological:     General: No focal deficit present.     Mental  Status: She is alert and oriented to person, place, and time.  Psychiatric:        Mood and Affect: Mood normal.        Behavior: Behavior normal.      Results for orders placed or performed in visit on 08/11/24  POCT CBG (Fasting - Glucose)  Result Value Ref Range   Glucose Fasting, POC 123 (  A) 70 - 99 mg/dL    Recent Results (from the past 2160 hours)  Herpes culture, rapid     Status: None   Collection Time: 06/03/24  1:49 PM   RC  Result Value Ref Range   HSV Culture/Type Comment     Comment: Negative No Herpes simplex virus isolated.    Viral Culture, Rapid,Varicella Comment     Comment: Negative No Varicella zoster virus detected by rapid viral culture.   POCT CBG (Fasting - Glucose)     Status: Abnormal   Collection Time: 08/11/24 10:49 AM  Result Value Ref Range   Glucose Fasting, POC 123 (A) 70 - 99 mg/dL      Assessment & Plan:  Continue medications as prescribed. Routine blood work due. FU with patient on results. Bilateral hands xray to distinguish arthritis in addition to arthritis panel. Bone density due 09/2024. Problem List Items Addressed This Visit       Cardiovascular and Mediastinum   Hypertension associated with diabetes (HCC)   Relevant Orders   CMP14+EGFR   CBC with Diff     Endocrine   Other specified hypothyroidism   Relevant Orders   TSH   Type 2 diabetes mellitus without complication, without long-term current use of insulin (HCC)   Relevant Orders   POCT CBG (Fasting - Glucose) (Completed)   Hemoglobin A1c   Mixed hyperlipidemia due to type 2 diabetes mellitus (HCC) - Primary   Relevant Orders   Lipid Panel w/o Chol/HDL Ratio     Musculoskeletal and Integument   Osteoarthritis involving multiple joints on both sides of body   Relevant Orders   Arthritis Panel   DG HANDS 1 VIEW BILAT BALLCATCHERS     Other   Vitamin D  deficiency   Relevant Orders   Vitamin D  (25 hydroxy)   Screening for osteoporosis   Relevant Orders   DG  Bone Density   Overweight with body mass index (BMI) of 26 to 26.9 in adult    Return in about 3 months (around 11/09/2024).   Total time spent: 25 minutes. This time includes review of previous notes and results and patient face to face interaction during today's visit.    FERNAND FREDY RAMAN, MD  08/11/2024   This document may have been prepared by [facility] Voice Recognition software and as such may include unintentional dictation errors.      [1]  Allergies Allergen Reactions   Ace Inhibitors Cough  [2]  Outpatient Medications Prior to Visit  Medication Sig   amLODipine (NORVASC) 5 MG tablet TAKE (1) TABLET BY MOUTH EVERY DAY   clopidogrel (PLAVIX) 75 MG tablet Take 75 mg by mouth.   diclofenac sodium (VOLTAREN) 1 % GEL Apply topically.   FARXIGA  10 MG TABS tablet Take 1 tablet (10 mg total) by mouth daily.   gabapentin (NEURONTIN) 300 MG capsule Take 300 mg by mouth 2 (two) times daily.   hydrochlorothiazide  (HYDRODIURIL ) 25 MG tablet Take 1 tablet (25 mg total) by mouth daily.   levothyroxine  (SYNTHROID ) 100 MCG tablet Take 1 tablet (100 mcg total) by mouth daily before breakfast.   metFORMIN  (GLUCOPHAGE ) 500 MG tablet Take 500 mg by mouth 2 (two) times daily with a meal.   metoprolol  succinate (TOPROL -XL) 25 MG 24 hr tablet Take 1 tablet (25 mg total) by mouth daily.   metoprolol  succinate (TOPROL -XL) 50 MG 24 hr tablet TAKE ONE (1) TABLET BY MOUTH ONCE DAILY. IN ADDITION TO THE 25MG  TABLET FOR TOTAL OF 75MG  DAILY.  rosuvastatin (CRESTOR) 10 MG tablet TAKE (1) TABLET BY MOUTH EVERY DAY   sodium fluoride (PREVIDENT) 1.1 % GEL dental gel Apply topically.   Calcium Carbonate-Vitamin D  600-400 MG-UNIT tablet Take by mouth. (Patient not taking: Reported on 08/11/2024)   traMADol (ULTRAM) 50 MG tablet Take by mouth. (Patient not taking: Reported on 08/11/2024)   No facility-administered medications prior to visit.

## 2024-08-12 LAB — CBC WITH DIFFERENTIAL/PLATELET
Basophils Absolute: 0.1 x10E3/uL (ref 0.0–0.2)
Basos: 1 %
EOS (ABSOLUTE): 0.4 x10E3/uL (ref 0.0–0.4)
Eos: 5 %
Hematocrit: 45.4 % (ref 34.0–46.6)
Hemoglobin: 14 g/dL (ref 11.1–15.9)
Immature Grans (Abs): 0 x10E3/uL (ref 0.0–0.1)
Immature Granulocytes: 0 %
Lymphocytes Absolute: 2.2 x10E3/uL (ref 0.7–3.1)
Lymphs: 26 %
MCH: 25.7 pg — ABNORMAL LOW (ref 26.6–33.0)
MCHC: 30.8 g/dL — ABNORMAL LOW (ref 31.5–35.7)
MCV: 84 fL (ref 79–97)
Monocytes Absolute: 0.5 x10E3/uL (ref 0.1–0.9)
Monocytes: 6 %
Neutrophils Absolute: 5.2 x10E3/uL (ref 1.4–7.0)
Neutrophils: 62 %
Platelets: 311 x10E3/uL (ref 150–450)
RBC: 5.44 x10E6/uL — ABNORMAL HIGH (ref 3.77–5.28)
RDW: 15.8 % — ABNORMAL HIGH (ref 11.7–15.4)
WBC: 8.5 x10E3/uL (ref 3.4–10.8)

## 2024-08-12 LAB — CMP14+EGFR
ALT: 16 IU/L (ref 0–32)
AST: 19 IU/L (ref 0–40)
Albumin: 4.4 g/dL (ref 3.8–4.8)
Alkaline Phosphatase: 131 IU/L (ref 49–135)
BUN/Creatinine Ratio: 20 (ref 12–28)
BUN: 16 mg/dL (ref 8–27)
Bilirubin Total: 0.3 mg/dL (ref 0.0–1.2)
CO2: 21 mmol/L (ref 20–29)
Calcium: 9.5 mg/dL (ref 8.7–10.3)
Chloride: 101 mmol/L (ref 96–106)
Creatinine, Ser: 0.79 mg/dL (ref 0.57–1.00)
Globulin, Total: 2.9 g/dL (ref 1.5–4.5)
Glucose: 120 mg/dL — ABNORMAL HIGH (ref 70–99)
Potassium: 3.6 mmol/L (ref 3.5–5.2)
Sodium: 144 mmol/L (ref 134–144)
Total Protein: 7.3 g/dL (ref 6.0–8.5)
eGFR: 78 mL/min/1.73 (ref >59)

## 2024-08-12 LAB — LIPID PANEL W/O CHOL/HDL RATIO
Cholesterol, Total: 172 mg/dL (ref 100–199)
HDL: 51 mg/dL (ref >39)
LDL Chol Calc (NIH): 89 mg/dL (ref 0–99)
Triglycerides: 189 mg/dL — ABNORMAL HIGH (ref 0–149)
VLDL Cholesterol Cal: 32 mg/dL (ref 5–40)

## 2024-08-12 LAB — TSH: TSH: 1.62 u[IU]/mL (ref 0.450–4.500)

## 2024-08-12 LAB — HEMOGLOBIN A1C
Est. average glucose Bld gHb Est-mCnc: 163 mg/dL
Hgb A1c MFr Bld: 7.3 % — ABNORMAL HIGH (ref 4.8–5.6)

## 2024-08-12 LAB — ARTHRITIS PANEL
Anti Nuclear Antibody (ANA): NEGATIVE
Rheumatoid fact SerPl-aCnc: <10 [IU]/mL (ref <14.0)
Sed Rate: 23 mm/h (ref 0–40)
Uric Acid: 3.3 mg/dL (ref 3.1–7.9)

## 2024-08-12 LAB — VITAMIN D 25 HYDROXY (VIT D DEFICIENCY, FRACTURES): Vit D, 25-Hydroxy: 35.1 ng/mL (ref 30.0–100.0)

## 2024-08-13 MED ORDER — METFORMIN HCL 1000 MG PO TABS: 1000.0000 mg | ORAL_TABLET | Freq: Two times a day (BID) | ORAL | 3 refills | Status: AC

## 2024-08-13 NOTE — Progress Notes (Signed)
 Patient notified

## 2024-08-21 NOTE — Progress Notes (Signed)
 Patient notified

## 2024-09-09 ENCOUNTER — Ambulatory Visit

## 2024-09-09 DIAGNOSIS — M8589 Other specified disorders of bone density and structure, multiple sites: Secondary | ICD-10-CM | POA: Diagnosis not present

## 2024-09-09 DIAGNOSIS — Z13820 Encounter for screening for osteoporosis: Secondary | ICD-10-CM

## 2024-10-02 ENCOUNTER — Other Ambulatory Visit: Payer: Self-pay | Admitting: Internal Medicine

## 2024-11-10 ENCOUNTER — Ambulatory Visit: Admitting: Internal Medicine
